# Patient Record
Sex: Female | Born: 1960
Health system: Southern US, Community
[De-identification: ages and names within clinical notes are randomized; demographics above are authoritative.]

## PROBLEM LIST (undated history)

## (undated) DIAGNOSIS — E785 Hyperlipidemia, unspecified: Secondary | ICD-10-CM

## (undated) DIAGNOSIS — R51 Headache: Secondary | ICD-10-CM

## (undated) DIAGNOSIS — R519 Headache, unspecified: Secondary | ICD-10-CM

## (undated) DIAGNOSIS — I10 Essential (primary) hypertension: Secondary | ICD-10-CM

## (undated) DIAGNOSIS — S42309A Unspecified fracture of shaft of humerus, unspecified arm, initial encounter for closed fracture: Secondary | ICD-10-CM

## (undated) DIAGNOSIS — I219 Acute myocardial infarction, unspecified: Secondary | ICD-10-CM

## (undated) DIAGNOSIS — K219 Gastro-esophageal reflux disease without esophagitis: Secondary | ICD-10-CM

## (undated) DIAGNOSIS — I2510 Atherosclerotic heart disease of native coronary artery without angina pectoris: Secondary | ICD-10-CM

## (undated) DIAGNOSIS — E669 Obesity, unspecified: Secondary | ICD-10-CM

## (undated) HISTORY — PX: FRACTURE SURGERY: SHX138

## (undated) HISTORY — DX: Hyperlipidemia, unspecified: E78.5

## (undated) HISTORY — DX: Atherosclerotic heart disease of native coronary artery without angina pectoris: I25.10

## (undated) HISTORY — PX: CARDIAC CATHETERIZATION: SHX172

## (undated) HISTORY — DX: Essential (primary) hypertension: I10

## (undated) HISTORY — PX: EYE SURGERY: SHX253

## (undated) HISTORY — DX: Gastro-esophageal reflux disease without esophagitis: K21.9

## (undated) HISTORY — PX: OTHER SURGICAL HISTORY: SHX169

## (undated) HISTORY — PX: MULTIPLE TOOTH EXTRACTIONS: SHX2053

## (undated) HISTORY — PX: APPENDECTOMY: SHX54

## (undated) HISTORY — DX: Obesity, unspecified: E66.9

---

## 1998-05-08 ENCOUNTER — Emergency Department (HOSPITAL_COMMUNITY): Admission: EM | Admit: 1998-05-08 | Discharge: 1998-05-08 | Payer: Self-pay | Admitting: Emergency Medicine

## 1998-05-14 ENCOUNTER — Encounter: Payer: Self-pay | Admitting: Surgery

## 1998-05-14 ENCOUNTER — Ambulatory Visit (HOSPITAL_COMMUNITY): Admission: RE | Admit: 1998-05-14 | Discharge: 1998-05-14 | Payer: Self-pay | Admitting: Surgery

## 1999-04-20 ENCOUNTER — Encounter (INDEPENDENT_AMBULATORY_CARE_PROVIDER_SITE_OTHER): Payer: Self-pay | Admitting: Specialist

## 1999-04-21 ENCOUNTER — Inpatient Hospital Stay (HOSPITAL_COMMUNITY): Admission: EM | Admit: 1999-04-21 | Discharge: 1999-04-22 | Payer: Self-pay | Admitting: Emergency Medicine

## 2000-07-11 ENCOUNTER — Other Ambulatory Visit: Admission: RE | Admit: 2000-07-11 | Discharge: 2000-07-11 | Payer: Self-pay | Admitting: Obstetrics and Gynecology

## 2001-04-20 ENCOUNTER — Encounter: Payer: Self-pay | Admitting: Emergency Medicine

## 2001-04-20 ENCOUNTER — Inpatient Hospital Stay (HOSPITAL_COMMUNITY): Admission: RE | Admit: 2001-04-20 | Discharge: 2001-04-24 | Payer: Self-pay | Admitting: Unknown Physician Specialty

## 2001-05-09 ENCOUNTER — Encounter (HOSPITAL_COMMUNITY): Admission: RE | Admit: 2001-05-09 | Discharge: 2001-08-07 | Payer: Self-pay | Admitting: *Deleted

## 2001-08-08 ENCOUNTER — Encounter (HOSPITAL_COMMUNITY): Admission: RE | Admit: 2001-08-08 | Discharge: 2001-09-19 | Payer: Self-pay | Admitting: *Deleted

## 2001-09-05 ENCOUNTER — Ambulatory Visit (HOSPITAL_COMMUNITY): Admission: RE | Admit: 2001-09-05 | Discharge: 2001-09-05 | Payer: Self-pay | Admitting: *Deleted

## 2001-11-30 ENCOUNTER — Ambulatory Visit (HOSPITAL_BASED_OUTPATIENT_CLINIC_OR_DEPARTMENT_OTHER): Admission: RE | Admit: 2001-11-30 | Discharge: 2001-11-30 | Payer: Self-pay | Admitting: General Surgery

## 2002-11-23 ENCOUNTER — Ambulatory Visit (HOSPITAL_COMMUNITY): Admission: RE | Admit: 2002-11-23 | Discharge: 2002-11-23 | Payer: Self-pay | Admitting: *Deleted

## 2002-11-23 ENCOUNTER — Encounter: Payer: Self-pay | Admitting: *Deleted

## 2002-11-26 ENCOUNTER — Encounter: Payer: Self-pay | Admitting: *Deleted

## 2002-11-26 ENCOUNTER — Ambulatory Visit (HOSPITAL_COMMUNITY): Admission: RE | Admit: 2002-11-26 | Discharge: 2002-11-26 | Payer: Self-pay | Admitting: *Deleted

## 2003-04-08 ENCOUNTER — Other Ambulatory Visit: Admission: RE | Admit: 2003-04-08 | Discharge: 2003-04-08 | Payer: Self-pay | Admitting: Obstetrics and Gynecology

## 2004-09-30 ENCOUNTER — Encounter: Admission: RE | Admit: 2004-09-30 | Discharge: 2004-09-30 | Payer: Self-pay | Admitting: Orthopedic Surgery

## 2005-04-12 ENCOUNTER — Encounter: Payer: Self-pay | Admitting: Emergency Medicine

## 2005-04-12 ENCOUNTER — Inpatient Hospital Stay (HOSPITAL_COMMUNITY): Admission: AD | Admit: 2005-04-12 | Discharge: 2005-04-14 | Payer: Self-pay | Admitting: *Deleted

## 2005-05-17 ENCOUNTER — Ambulatory Visit: Payer: Self-pay | Admitting: Cardiology

## 2005-07-14 ENCOUNTER — Ambulatory Visit: Payer: Self-pay | Admitting: Cardiology

## 2005-10-15 ENCOUNTER — Ambulatory Visit: Payer: Self-pay | Admitting: Cardiology

## 2005-11-26 ENCOUNTER — Ambulatory Visit (HOSPITAL_COMMUNITY): Admission: RE | Admit: 2005-11-26 | Discharge: 2005-11-27 | Payer: Self-pay | Admitting: *Deleted

## 2005-12-09 ENCOUNTER — Ambulatory Visit (HOSPITAL_COMMUNITY): Admission: RE | Admit: 2005-12-09 | Discharge: 2005-12-09 | Payer: Self-pay | Admitting: *Deleted

## 2006-01-07 ENCOUNTER — Ambulatory Visit: Payer: Self-pay | Admitting: Cardiology

## 2006-05-13 ENCOUNTER — Ambulatory Visit (HOSPITAL_COMMUNITY): Admission: RE | Admit: 2006-05-13 | Discharge: 2006-05-13 | Payer: Self-pay | Admitting: *Deleted

## 2007-01-07 IMAGING — CT CT CHEST W/ CM
3 of 5 series · 16 of 30 positions shown, 18 images · IV contrast ([ID] [id number])
Comparison: Chest radiographs 11/26/05 and chest CT 04/28/03.

CLINICAL DATA: Patient with recent pneumonia.  Nodule seen chest x-ray.  
 CHEST CT WITH CONTRAST:
TECHNIQUE: Multidetector CT imaging of the chest was performed following the standard protocol during bolus administration of intravenous contrast.
 Contrast:  100 cc Omnipaque 300

[Series 3: chest · axial · 0.68mm/px · z∈[-200,-193]mm · 2 of 113 slices shown]
[im 57/113  lung]
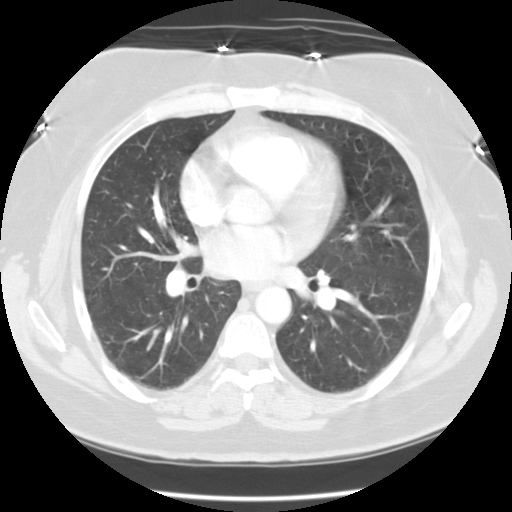
[im 60/113  lung]
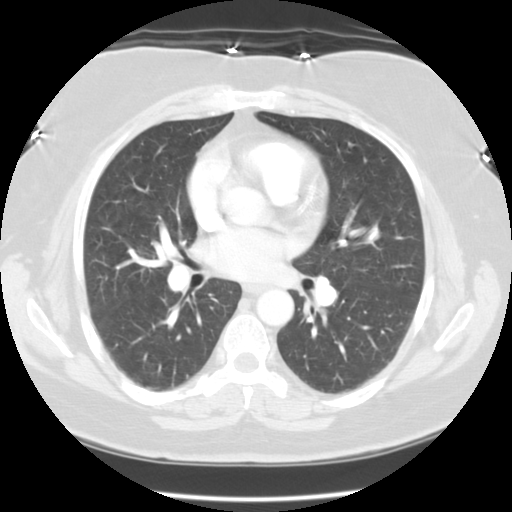

[Series 5: recon 3: chest · axial · 0.68mm/px · z∈[-300,-100]mm · 7 of 225 slices shown, 9 images]
[im 33/225  mediastinal]
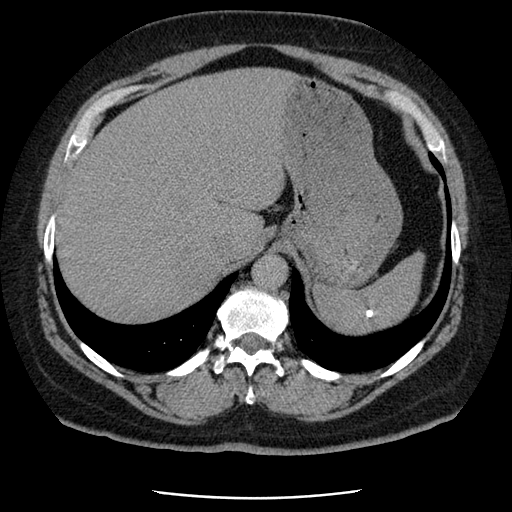
[im 33/225  lung]
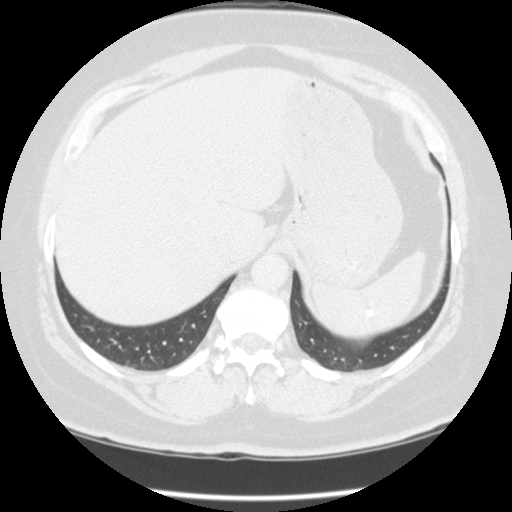
[im 65/225  lung]
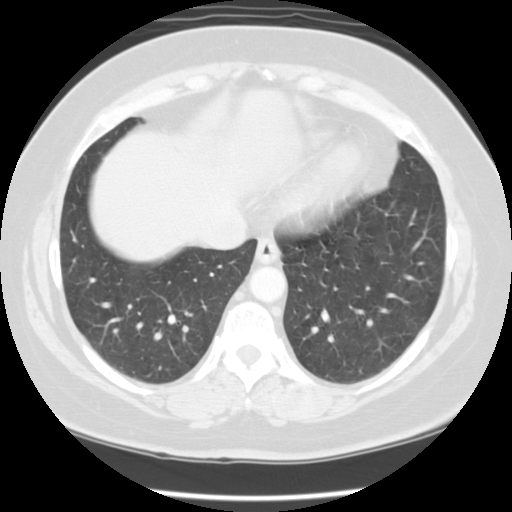
[im 97/225  lung]
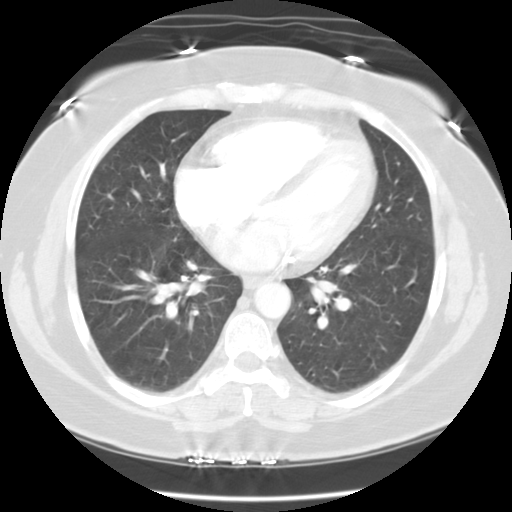
[im 119/225  lung]
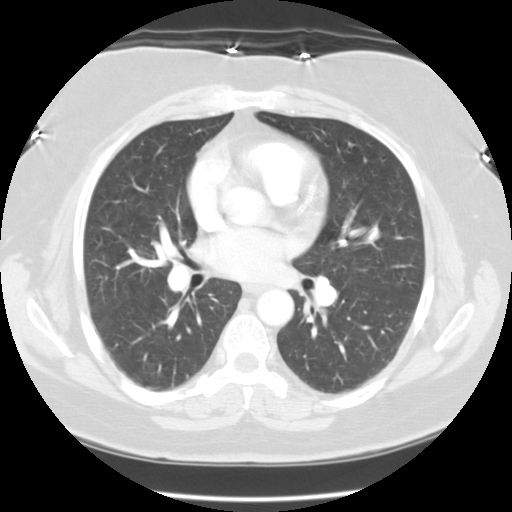
[im 129/225  mediastinal]
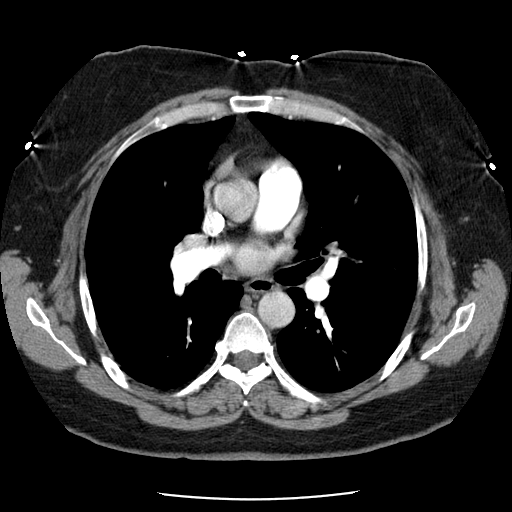
[im 129/225  lung]
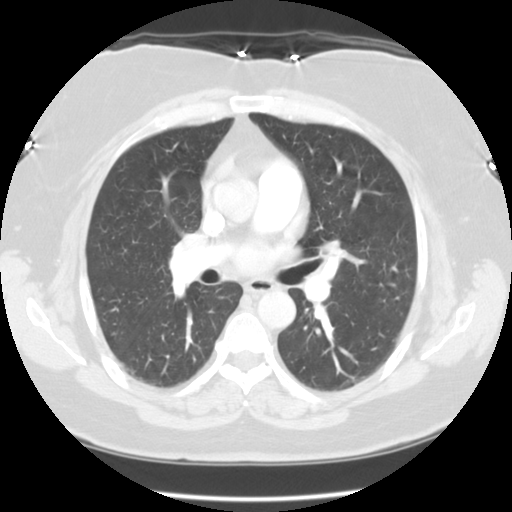
[im 161/225  lung]
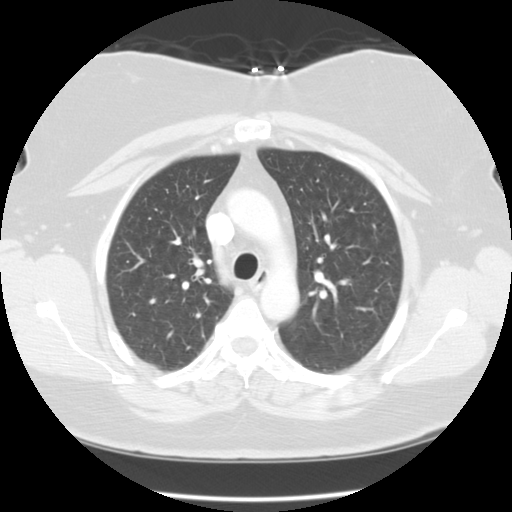
[im 193/225  lung]
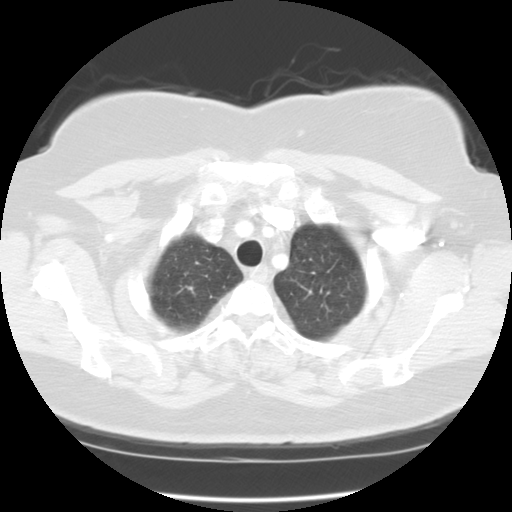

[Series 501: reformatted · sagittal · 0.68mm/px · 7 of 249 slices shown]
[im 32/249  lung]
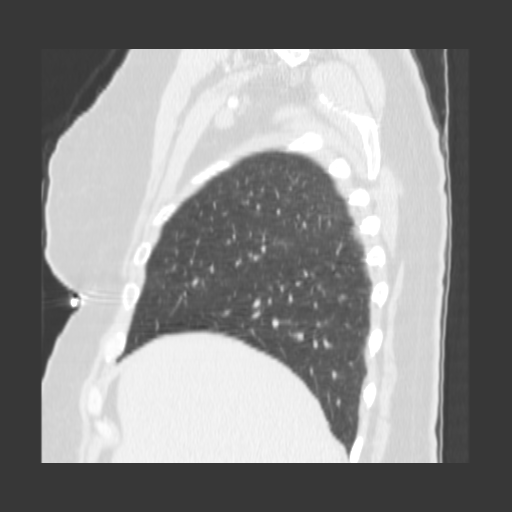
[im 63/249  lung]
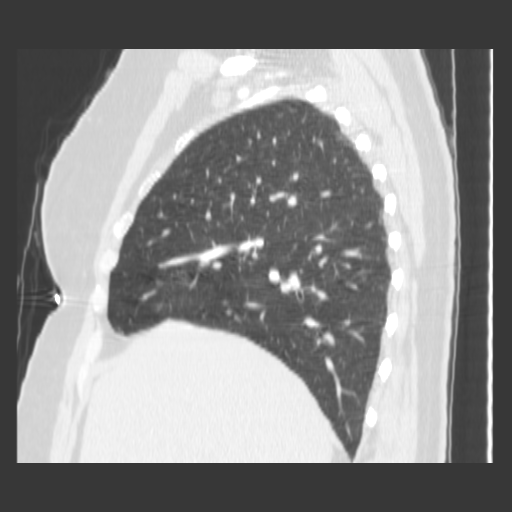
[im 94/249  lung]
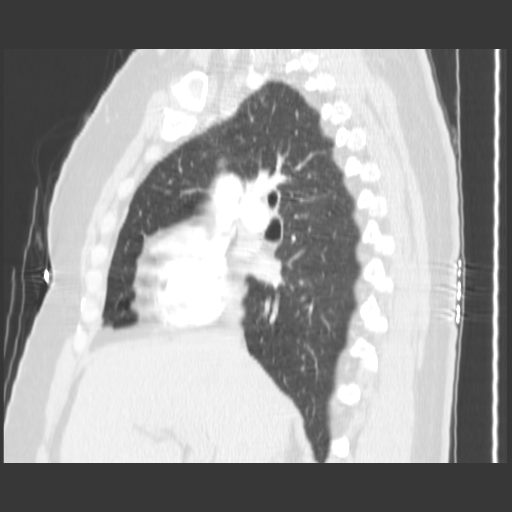
[im 125/249  lung]
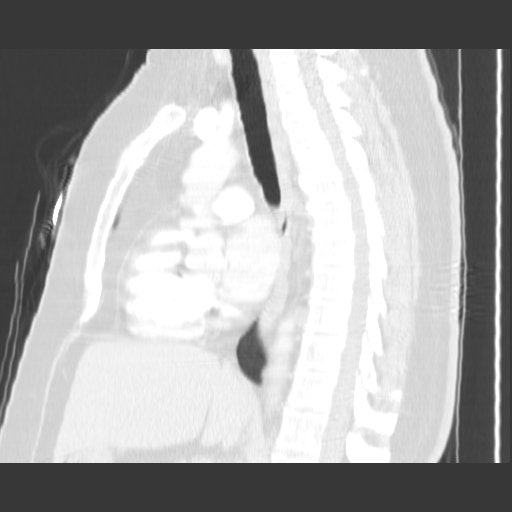
[im 156/249  lung]
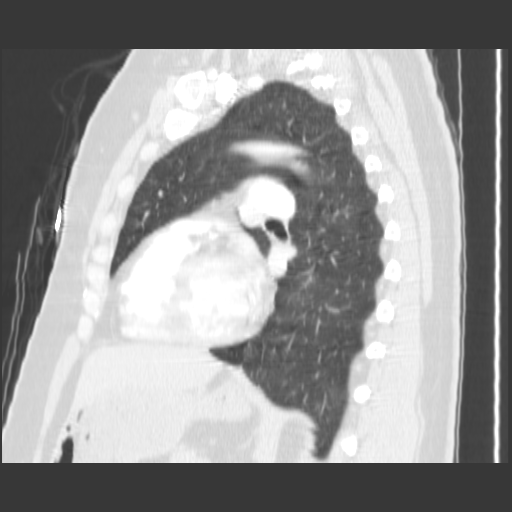
[im 187/249  lung]
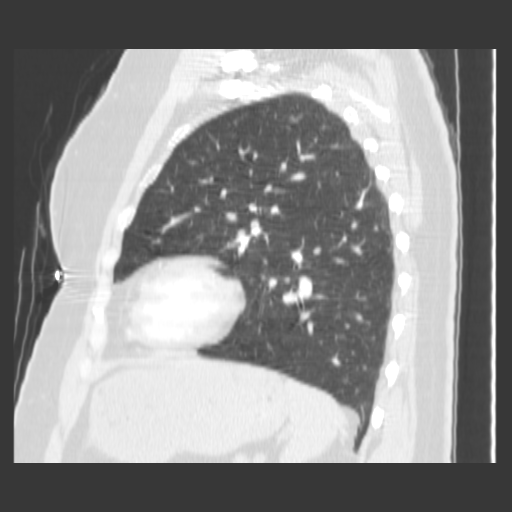
[im 218/249  lung]
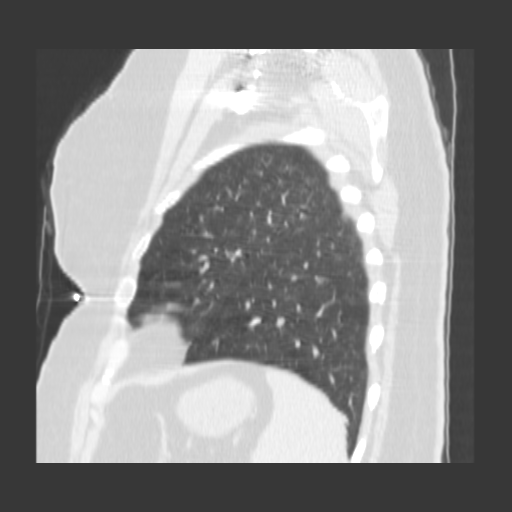

[16 of 30 positions shown; findings below may reference images not displayed]

FINDINGS: There is no axillary, hilar, or mediastinal lymphadenopathy.  Coronary artery stents are noted.  There is no pleural or pericardial effusion.  Lungs demonstrate changes of centrilobular emphysema diffusely.  However, there is no pulmonary nodule or mass.  Incidental imaging of the upper abdomen demonstrates calcified granulomas in the spleen.  Upper abdomen is otherwise unremarkable.  No focal bony abnormality.
IMPRESSION: 1.  Negative for pulmonary nodule or mass.  
 2.  Centrilobular emphysema.

## 2008-09-23 ENCOUNTER — Ambulatory Visit: Payer: Self-pay | Admitting: Internal Medicine

## 2008-10-04 ENCOUNTER — Ambulatory Visit: Payer: Self-pay | Admitting: Internal Medicine

## 2008-10-04 ENCOUNTER — Ambulatory Visit: Payer: Self-pay

## 2008-10-04 LAB — CONVERTED CEMR LAB
ALT: 35 U/L
AST: 21 U/L
Albumin: 3.8 g/dL
Alkaline Phosphatase: 66 U/L
BUN: 16 mg/dL
Bilirubin, Direct: 0.1 mg/dL
CO2: 28 meq/L
Calcium: 9.1 mg/dL
Chloride: 104 meq/L
Cholesterol: 182 mg/dL
Creatinine, Ser: 0.8 mg/dL
GFR calc Af Amer: 99 mL/min
GFR calc non Af Amer: 82 mL/min
Glucose, Bld: 103 mg/dL — ABNORMAL HIGH
HDL: 39.4 mg/dL
LDL Cholesterol: 112 mg/dL — ABNORMAL HIGH
Potassium: 4 meq/L
Sodium: 140 meq/L
Total Bilirubin: 0.8 mg/dL
Total CHOL/HDL Ratio: 4.6
Total Protein: 6.5 g/dL
Triglycerides: 151 mg/dL — ABNORMAL HIGH
VLDL: 30 mg/dL

## 2009-01-28 ENCOUNTER — Encounter (INDEPENDENT_AMBULATORY_CARE_PROVIDER_SITE_OTHER): Payer: Self-pay | Admitting: *Deleted

## 2009-02-12 ENCOUNTER — Ambulatory Visit: Payer: Self-pay | Admitting: Internal Medicine

## 2009-02-12 DIAGNOSIS — L723 Sebaceous cyst: Secondary | ICD-10-CM | POA: Insufficient documentation

## 2009-02-12 DIAGNOSIS — L988 Other specified disorders of the skin and subcutaneous tissue: Secondary | ICD-10-CM | POA: Insufficient documentation

## 2009-02-12 DIAGNOSIS — K219 Gastro-esophageal reflux disease without esophagitis: Secondary | ICD-10-CM | POA: Insufficient documentation

## 2009-02-12 DIAGNOSIS — E785 Hyperlipidemia, unspecified: Secondary | ICD-10-CM | POA: Insufficient documentation

## 2009-02-12 DIAGNOSIS — I10 Essential (primary) hypertension: Secondary | ICD-10-CM | POA: Insufficient documentation

## 2009-02-14 ENCOUNTER — Encounter (INDEPENDENT_AMBULATORY_CARE_PROVIDER_SITE_OTHER): Payer: Self-pay | Admitting: *Deleted

## 2009-10-16 ENCOUNTER — Telehealth: Payer: Self-pay | Admitting: Internal Medicine

## 2009-11-10 DIAGNOSIS — E663 Overweight: Secondary | ICD-10-CM | POA: Insufficient documentation

## 2009-11-14 ENCOUNTER — Ambulatory Visit: Payer: Self-pay | Admitting: Internal Medicine

## 2009-11-18 DIAGNOSIS — F17200 Nicotine dependence, unspecified, uncomplicated: Secondary | ICD-10-CM | POA: Insufficient documentation

## 2009-11-26 ENCOUNTER — Ambulatory Visit: Payer: Self-pay | Admitting: Internal Medicine

## 2009-11-26 DIAGNOSIS — H9319 Tinnitus, unspecified ear: Secondary | ICD-10-CM | POA: Insufficient documentation

## 2010-01-23 ENCOUNTER — Ambulatory Visit: Payer: Self-pay

## 2010-01-23 ENCOUNTER — Ambulatory Visit: Payer: Self-pay | Admitting: Internal Medicine

## 2010-01-23 DIAGNOSIS — M79609 Pain in unspecified limb: Secondary | ICD-10-CM | POA: Insufficient documentation

## 2010-02-03 ENCOUNTER — Ambulatory Visit: Payer: Self-pay

## 2010-02-03 ENCOUNTER — Encounter: Payer: Self-pay | Admitting: Internal Medicine

## 2010-06-21 ENCOUNTER — Emergency Department (HOSPITAL_COMMUNITY): Admission: EM | Admit: 2010-06-21 | Discharge: 2010-06-21 | Payer: Self-pay | Admitting: Emergency Medicine

## 2010-06-22 ENCOUNTER — Encounter: Payer: Self-pay | Admitting: Internal Medicine

## 2010-06-23 ENCOUNTER — Encounter: Payer: Self-pay | Admitting: Internal Medicine

## 2010-06-23 ENCOUNTER — Telehealth: Payer: Self-pay | Admitting: Internal Medicine

## 2010-06-26 ENCOUNTER — Encounter: Payer: Self-pay | Admitting: Internal Medicine

## 2010-10-11 ENCOUNTER — Encounter: Payer: Self-pay | Admitting: Orthopedic Surgery

## 2010-10-18 LAB — CONVERTED CEMR LAB
ALT: 43 units/L — ABNORMAL HIGH (ref 0–35)
AST: 31 units/L (ref 0–37)
Alkaline Phosphatase: 79 units/L (ref 39–117)
BUN: 20 mg/dL (ref 6–23)
Basophils Absolute: 0 10*3/uL (ref 0.0–0.1)
Basophils Relative: 0.5 % (ref 0.0–3.0)
Bilirubin, Direct: 0.1 mg/dL (ref 0.0–0.3)
CO2: 27 meq/L (ref 19–32)
Eosinophils Absolute: 0.3 10*3/uL (ref 0.0–0.7)
Eosinophils Relative: 5.6 % — ABNORMAL HIGH (ref 0.0–5.0)
GFR calc non Af Amer: 62.94 mL/min (ref >60)
HCT: 45.9 % (ref 36.0–46.0)
HDL: 43.5 mg/dL (ref >39.00)
Lymphs Abs: 2.1 10*3/uL (ref 0.7–4.0)
Monocytes Relative: 7.1 % (ref 3.0–12.0)
Neutro Abs: 3 10*3/uL (ref 1.4–7.7)
Neutrophils Relative %: 51 % (ref 43.0–77.0)
Platelets: 245 10*3/uL (ref 150.0–400.0)
Potassium: 4.8 meq/L (ref 3.5–5.1)
Sodium: 139 meq/L (ref 135–145)
Total Bilirubin: 0.7 mg/dL (ref 0.3–1.2)
Total Protein: 7.4 g/dL (ref 6.0–8.3)

## 2010-10-20 NOTE — Letter (Signed)
Summary: [facility]  [facility]   Imported By: Maryln Gottron 07/02/2010 15:13:06  _____________________________________________________________________  External Attachment:    Type:   Image     Comment:   External Document

## 2010-10-20 NOTE — Letter (Signed)
Summary: [facility] Clear[facility]   [facility]   Imported By: Roderic Ovens 07/01/2010 10:59:21  _____________________________________________________________________  External Attachment:    Type:   Image     Comment:   External Document

## 2010-10-20 NOTE — Letter (Signed)
Summary: [facility]  [facility]   Imported By: Maryln Gottron 06/26/2010 14:02:38  _____________________________________________________________________  External Attachment:    Type:   Image     Comment:   External Document

## 2010-10-20 NOTE — Progress Notes (Signed)
Summary: REFILL   Phone Note Refill Request Message from:  Patient on October 16, 2009 11:09 AM  Refills Requested: Medication #1:  PANTOPRAZOLE SODIUM 40 MG TBEC 1 once daily  Medication #2:  FOLIC ACID 1 MG TABS 1 once daily SEND TO [facility] [phone]  Initial call taken by: Judie Grieve,  October 16, 2009 11:10 AM  Follow-up for Phone Call        called [facility] spoke to Dr Kirtland Bouchard, they will fill. Follow-up by: Hardin Negus, RMA,  October 16, 2009 12:35 PM

## 2010-10-20 NOTE — Assessment & Plan Note (Signed)
Summary: EVAL OF RINGING IN EARS (TINNITUS?) // RS PT RSC/NJR//PT RESC...   Vital Signs:  Patient profile:   50 year old female Weight:      238 pounds Temp:     98.4 degrees F oral BP sitting:   76 / 50  (right arm) Cuff size:   regular  Vitals Entered By: Duard Brady LPN (November 26, 1608 10:41 AM) CC: c/o ringing in both ears for quite sometime    - needs refill on ppi #90 3RF please Is Patient Diabetic? No   CC:  c/o ringing in both ears for quite sometime    - needs refill on ppi #90 3RF please.  History of Present Illness: 50 year old patient who has a history of coronary artery disease, hypertension, and dyslipidemia, who is followed closely by cardiology.  She has a long history of mild tinnitus which has intensified over the past 4 weeks.  She describes a very loud ringing in the she believes is more localized to the right ear.  Denies any hearing loss, vertigo, or any other focal neurological symptoms.  No pertinent history for unusual noise exposure or trauma  Preventive Screening-Counseling & Management  Alcohol-Tobacco     Smoking Status: current  Allergies: 1)  ! Gnp Iodine (Iodine)  Past History:  Past Medical History: 1. Premature coronary artery disease.     a.     Status post MI x2.     b.     Status post stenting of the left circumflex in 2002.     c.     Status post in-stent restenosis to the left circumflex with      Cypher drug-eluting stent in 2006, also stenting of the LAD, and      angioplasty of the diagonal in 2006. 2. ongoing tobacco use 3. statin therapy 4. Hyperlipidemia 5. Hypertension 6. GERD 7. Obesity Tinnitus  Family History: Reviewed history from 02/12/2009 and no changes required. father had a MI at age 22 and died of coronary disease at 28 mother died following surgery for abdominal aortic aneurysm  One brother died in a motor vehicle accident  Review of Systems  The patient denies anorexia, fever, weight loss, weight  gain, vision loss, decreased hearing, hoarseness, chest pain, syncope, dyspnea on exertion, peripheral edema, prolonged cough, headaches, hemoptysis, abdominal pain, melena, hematochezia, severe indigestion/heartburn, hematuria, incontinence, genital sores, muscle weakness, suspicious skin lesions, transient blindness, difficulty walking, depression, unusual weight change, abnormal bleeding, enlarged lymph nodes, angioedema, and breast masses.    Physical Exam  General:  overweight-appearing.  96/64overweight-appearing.   Head:  Normocephalic and atraumatic without obvious abnormalities. No apparent alopecia or balding. Eyes:  No corneal or conjunctival inflammation noted. EOMI. Perrla. Funduscopic exam benign, without hemorrhages, exudates or papilledema. Vision grossly normal. Ears:  External ear exam shows no significant lesions or deformities.  Otoscopic examination reveals clear canals, tympanic membranes are intact bilaterally without bulging, retraction, inflammation or discharge. Hearing is grossly normal bilaterally. Weber does not lateralize Mouth:  Oral mucosa and oropharynx without lesions or exudates.  Teeth in good repair. Neurologic:  alert & oriented X3, cranial nerves II-XII intact, strength normal in all extremities, sensation intact to pinprick, and gait normal.  alert & oriented X3, cranial nerves II-XII intact, strength normal in all extremities, sensation intact to pinprick, and gait normal.     Impression & Recommendations:  Problem # 1:  HYPERTENSION (ICD-401.9)  Her updated medication list for this problem includes:    Metoprolol Tartrate  25 Mg Tabs (Metoprolol tartrate) .Marland Kitchen... 1 two times a day    Ramipril 2.5 Mg Caps (Ramipril) .Marland Kitchen... 1 once daily  Her updated medication list for this problem includes:    Metoprolol Tartrate 25 Mg Tabs (Metoprolol tartrate) .Marland Kitchen... 1 two times a day    Ramipril 2.5 Mg Caps (Ramipril) .Marland Kitchen... 1 once daily  Problem # 2:  TINNITUS, CHRONIC,  RIGHT (ICD-388.30)  Orders: ENT Referral (ENT)  Complete Medication List: 1)  Pantoprazole Sodium 40 Mg Tbec (Pantoprazole sodium) .Marland Kitchen.. 1 once daily 2)  Plavix 75 Mg Tabs (Clopidogrel bisulfate) .Marland Kitchen.. 1 once daily 3)  Bufferin 325 Mg Tabs (Aspirin buf(cacarb-mgcarb-mgo)) .Marland Kitchen.. 1 once daily 4)  Folic Acid 1 Mg Tabs (Folic acid) .Marland Kitchen.. 1 once daily 5)  Metoprolol Tartrate 25 Mg Tabs (Metoprolol tartrate) .Marland Kitchen.. 1 two times a day 6)  Isosorbide Mononitrate Cr 30 Mg Xr24h-tab (Isosorbide mononitrate) .Marland Kitchen.. 1 by mouth dialy 7)  Ramipril 2.5 Mg Caps (Ramipril) .Marland Kitchen.. 1 once daily 8)  Crestor 40 Mg Tabs (Rosuvastatin calcium) .Marland Kitchen.. 1 once daily 9)  Nitrostat 0.4 Mg Subl (Nitroglycerin) .... As needed 10)  Vitamin B-12 100 Mcg Tabs (Cyanocobalamin) .Marland Kitchen.. 1 by mouth daily  Patient Instructions: 1)  Please schedule a follow-up appointment in 6 months. 2)  Limit your Sodium (Salt). 3)  It is important that you exercise regularly at least 20 minutes 5 times a week. If you develop chest pain, have severe difficulty breathing, or feel very tired , stop exercising immediately and seek medical attention. 4)  You need to lose weight. Consider a lower calorie diet and regular exercise.  5)  ENT referral

## 2010-10-20 NOTE — Assessment & Plan Note (Signed)
Summary: ROV  Medications Added ISOSORBIDE MONONITRATE CR 30 MG XR24H-TAB (ISOSORBIDE MONONITRATE) 1 by mouth dialy NITROSTAT 0.4 MG SUBL (NITROGLYCERIN) as needed VITAMIN B-12 100 MCG TABS (CYANOCOBALAMIN) 1 by mouth daily      Allergies Added:   History of Present Illness: [name] is a delightful 50 year old woman with a history of premature coronary artery disease, hyperlipidemia, and ongoing tobacco use.   She is status post 2 myocardial infarctions.  She had her first heart attack at the age of 18 and it was treated with a stent to her left circumflex.  In July 2006, she underwent her most recent catheterization for recurrent angina.  This showed in-stent restenosis of the circumflex as well as 80% lesion in the LAD and first diagonal.  She underwent Cypher drug-eluting stent for in-stent restenosis of the left circumflex as well as to the LAD.  She had angioplasty of the second diagonal.  She was enrolled in the Triton study.  Returns for routine f/u.   From a cardiac point of view, she is doing very well.  She denies any chest pain or shortness of breath.  She is walking on treadmill everday for 30 mins (almost 2 miles) no CP or dyspnea. Still smoking 1 ppd. No orthopena or PND. Has had recent bronchitis which is improving. Previously did not have much warning prior to MIs.  Had u/s of carotid and abdominal aorta which was normal.    Problems Prior to Update: 1)  Overweight/obesity  (ICD-278.02) 2)  Gerd  (ICD-530.81) 3)  Other Specified Disorder of Skin  (ICD-709.8) 4)  Sebaceous Cyst, Infected  (ICD-706.2) 5)  Hypertension  (ICD-401.9) 6)  Hyperlipidemia  (ICD-272.4) 7)  Coronary Artery Disease  (ICD-414.00)  Current Medications (verified): 1)  Pantoprazole Sodium 40 Mg Tbec (Pantoprazole Sodium) .Marland Kitchen.. 1 Once Daily 2)  Plavix 75 Mg Tabs (Clopidogrel Bisulfate) .Marland Kitchen.. 1 Once Daily 3)  Bufferin 325 Mg Tabs (Aspirin Buf(Cacarb-Mgcarb-Mgo)) .Marland Kitchen.. 1 Once Daily 4)  Folic Acid 1 Mg  Tabs (Folic Acid) .Marland Kitchen.. 1 Once Daily 5)  Metoprolol Tartrate 25 Mg Tabs (Metoprolol Tartrate) .Marland Kitchen.. 1 Two Times A Day 6)  Isosorbide Mononitrate Cr 30 Mg Xr24h-Tab (Isosorbide Mononitrate) .Marland Kitchen.. 1 By Mouth Dialy 7)  Ramipril 2.5 Mg Caps (Ramipril) .Marland Kitchen.. 1 Once Daily 8)  Crestor 40 Mg Tabs (Rosuvastatin Calcium) .Marland Kitchen.. 1 Once Daily 9)  Nitrostat 0.4 Mg Subl (Nitroglycerin) .... As Needed 10)  Vitamin B-12 100 Mcg Tabs (Cyanocobalamin) .Marland Kitchen.. 1 By Mouth Daily  Allergies (verified): 1)  ! Gnp Iodine (Iodine)  Past History:  Past Medical History: 1. Premature coronary artery disease.     a.     Status post MI x2.     b.     Status post stenting of the left circumflex in 2002.     c.     Status post in-stent restenosis to the left circumflex with      Cypher drug-eluting stent in 2006, also stenting of the LAD, and      angioplasty of the diagonal in 2006. 2. ongoing tobacco use 3. statin therapy 4. Hyperlipidemia 5. Hypertension 6. GERD 7. Obesity  Vital Signs:  Patient profile:   50 year old female Height:      63 inches Weight:      234 pounds BMI:     41.60 Pulse rate:   71 / minute Resp:     16 per minute BP sitting:   98 / 72  (left arm)  Vitals Entered  By: Marrion Coy, CNA (November 14, 2009 9:07 AM)  Physical Exam  General:  Gen: well appearing. no resp difficulty HEENT: normal Neck: supple. no JVD. Carotids 2+ bilat; no bruits. No lymphadenopathy or thryomegaly appreciated. Cor: PMI nondisplaced. Regular rate & rhythm. No rubs, gallops, murmur. Lungs: clear Abdomen: soft, nontender, nondistended. No hepatosplenomegaly. No bruits or masses. Good bowel sounds. Extremities: no cyanosis, clubbing, rash, edema Neuro: alert & orientedx3, cranial nerves grossly intact. moves all 4 extremities w/o difficulty. affect pleasant     Impression & Recommendations:  Problem # 1:  CORONARY ARTERY DISEASE (ICD-414.00) Overall doing well but previous MIs not preceeded by any  signifcant symptoms. She is 5 years out from previous stress testing. will proceed with treadmill stress test to re-evaluate.  Problem # 2:  HYPERTENSION (ICD-401.9) Blood pressure well controlled. Continue current regimen.  Problem # 3:  HYPERLIPIDEMIA (ICD-272.4) Followed by PCP. She is fasting today so will draw blood for her. GOal LDL < 70. Continue statin.  Problem # 4:  TOBACCO USER (ICD-305.1) Counseled on need to quit.  Other Orders: Treadmill (Treadmill) TLB-BMP (Basic Metabolic Panel-BMET) (80048-METABOL) TLB-CBC Platelet - w/Differential (85025-CBCD) TLB-Hepatic/Liver Function Pnl (80076-HEPATIC) TLB-Lipid Panel (80061-LIPID) TLB-A1C / Hgb A1C (Glycohemoglobin) (83036-A1C) TLB-TSH (Thyroid Stimulating Hormone) (16109-UEA)  Patient Instructions: 1)  Labs today 2)  Your physician has requested that you have an exercise tolerance test.  For further information please visit [email or web address]  Please also follow instruction sheet, as given. 3)  Follow up in 1 year

## 2010-10-20 NOTE — Progress Notes (Signed)
Summary: surg clearance   Phone Note Other Incoming   Caller: Sherri with [employer] of Call: Sherri called stating pt had fallen and broken both of her ankels, she needs to have surgery right aware to repair one of them and needs clearance and ok to stop Plavix.  Per Dr Gala Romney ok for surgery and ok to hold Plavix, Sherri is aware and form faxed Initial call taken by: Meredith Staggers, RN,  June 23, 2010 3:38 PM

## 2010-11-16 ENCOUNTER — Ambulatory Visit: Payer: Self-pay | Admitting: Internal Medicine

## 2010-11-26 ENCOUNTER — Encounter (INDEPENDENT_AMBULATORY_CARE_PROVIDER_SITE_OTHER): Payer: Self-pay | Admitting: *Deleted

## 2010-12-01 NOTE — Letter (Signed)
Summary: Appointment - Reschedule  [facility], Main Office  [address]   [place], [place] [address]   Phone: [phone]  Fax: [phone]     November 26, 2010 MRN: [id number]   [name] [address] [place], [place]  [address]   Dear [name],   Due to a change in our office schedule, your appointment on  March 29,2012 at 4:00 must be changed.  It is very important that we reach you to reschedule this appointment. We look forward to participating in your health care needs. Please contact us at the number listed above at your earliest convenience to reschedule this appointment.     Sincerely, Pension scheme manager

## 2010-12-04 ENCOUNTER — Encounter (INDEPENDENT_AMBULATORY_CARE_PROVIDER_SITE_OTHER): Payer: Self-pay | Admitting: *Deleted

## 2010-12-07 ENCOUNTER — Encounter: Payer: Self-pay | Admitting: Internal Medicine

## 2010-12-08 NOTE — Letter (Signed)
Summary: Appointment - Reschedule  [facility], Main Office  [address]   [place], [place] [address]   Phone: [phone]  Fax: [phone]     December 04, 2010 MRN: [id number]   [name] [address] [place], [place]  [address]   Dear [name],   Due to a change in our office schedule, your appointment on  March 29,2012 at 4:00 must be changed.  It is very important that we reach you to reschedule this appointment. We look forward to participating in your health care needs. Please contact us at the number listed above at your earliest convenience to reschedule this appointment.     Sincerely, Control and instrumentation engineer

## 2010-12-17 ENCOUNTER — Ambulatory Visit: Payer: Self-pay | Admitting: Internal Medicine

## 2010-12-22 ENCOUNTER — Other Ambulatory Visit: Payer: Self-pay | Admitting: Internal Medicine

## 2011-01-04 ENCOUNTER — Ambulatory Visit (INDEPENDENT_AMBULATORY_CARE_PROVIDER_SITE_OTHER): Payer: BC Managed Care – PPO | Admitting: Internal Medicine

## 2011-01-04 ENCOUNTER — Encounter: Payer: Self-pay | Admitting: Internal Medicine

## 2011-01-04 ENCOUNTER — Ambulatory Visit: Payer: Self-pay | Admitting: Internal Medicine

## 2011-01-04 VITALS — BP 112/70 | HR 58 | Resp 18 | Ht 63.0 in | Wt 219.0 lb

## 2011-01-04 DIAGNOSIS — F17200 Nicotine dependence, unspecified, uncomplicated: Secondary | ICD-10-CM

## 2011-01-04 DIAGNOSIS — I2510 Atherosclerotic heart disease of native coronary artery without angina pectoris: Secondary | ICD-10-CM

## 2011-01-04 DIAGNOSIS — E785 Hyperlipidemia, unspecified: Secondary | ICD-10-CM

## 2011-01-04 MED ORDER — ISOSORBIDE MONONITRATE ER 30 MG PO TB24: 30.0000 mg | ORAL_TABLET | Freq: Every day | ORAL | Status: DC

## 2011-01-04 MED ORDER — METOPROLOL TARTRATE 25 MG PO TABS: 25.0000 mg | ORAL_TABLET | Freq: Two times a day (BID) | ORAL | Status: DC

## 2011-01-04 MED ORDER — RAMIPRIL 2.5 MG PO CAPS: 2.5000 mg | ORAL_CAPSULE | Freq: Every day | ORAL | Status: DC

## 2011-01-04 MED ORDER — CLOPIDOGREL BISULFATE 75 MG PO TABS: 75.0000 mg | ORAL_TABLET | Freq: Every day | ORAL | Status: DC

## 2011-01-04 MED ORDER — ROSUVASTATIN CALCIUM 40 MG PO TABS: 40.0000 mg | ORAL_TABLET | Freq: Every day | ORAL | Status: DC

## 2011-01-04 NOTE — Assessment & Plan Note (Signed)
Stable. Stress test last year without any evidence ov ischemia.

## 2011-01-04 NOTE — Assessment & Plan Note (Signed)
Discussed need to quit but not ready at this time due to weight loss efforts.

## 2011-01-04 NOTE — Assessment & Plan Note (Signed)
Due for repeat lipids. Will also check CMET and HgBA1c. Goal LDL < 70.

## 2011-01-04 NOTE — Progress Notes (Signed)
HPI:  [name] is a delightful 50 year old woman with a history of premature coronary artery disease, hyperlipidemia, and ongoing tobacco use.  She is status post 2 myocardial infarctions.  She had her first heart attack at the age of 89 and it was treated with a stent to her left circumflex.  In July 2006, she underwent her most recent catheterization for recurrent angina.  This showed in-stent restenosis of the circumflex as well as 80% lesion in the LAD and first diagonal.  She underwent Cypher drug-eluting stent for in-stent restenosis of the left circumflex as well as to the LAD.  She had angioplasty of the second diagonal.  She was enrolled in the Triton study. Had u/s of carotid and abdominal aorta which was normal.   ETT 5/11; no evidence of ischemia.  Returns for routine f/u.   From a cardiac point of view, she is doing very well. Broke both ankles last year and gained a bunch of weight. Now juicing 3x/day and has lost nearly 20 pounds. Walking on treadmill about 2x/week. No CP or SOB.  Still smoking 1 ppd. Previously did not have much warning prior to MIs.  Has not had lipids checked since last visit.    ROS: All systems negative except as listed in HPI, PMH and Problem List.  Past Medical History  Diagnosis Date  . CAD (coronary artery disease)     cypher stent  . HLD (hyperlipidemia)   . HTN (hypertension)   . GERD (gastroesophageal reflux disease)   . Obesity   . Tinnitus     Current Outpatient Prescriptions  Medication Sig Dispense Refill  . aspirin 325 MG tablet Take 325 mg by mouth daily.        . CRESTOR 40 MG tablet TAKE 1 TABLET AT BEDTIME  30 tablet  9  . isosorbide mononitrate (IMDUR) 30 MG 24 hr tablet TAKE 1 TABLET BY MOUTH EVERY DAY  30 tablet  8  . metoprolol tartrate (LOPRESSOR) 25 MG tablet Take 25 mg by mouth 2 (two) times daily.        . nitroGLYCERIN (NITROSTAT) 0.4 MG SL tablet Place 0.4 mg under the tongue every 5 (five) minutes as needed.        .  pantoprazole (PROTONIX) 40 MG tablet Take 40 mg by mouth daily.        Marland Kitchen PLAVIX 75 MG tablet TAKE 1 TABLET BY MOUTH EVERY DAY  30 tablet  8  . ramipril (ALTACE) 2.5 MG capsule TAKE 1 CAPSULE BY MOUTH EVERY DAY  30 capsule  8  . vitamin B-12 (CYANOCOBALAMIN) 100 MCG tablet Take 50 mcg by mouth daily.        . folic acid (FOLVITE) 1 MG tablet Take 1 mg by mouth daily.           PHYSICAL EXAM: Filed Vitals:   01/04/11 1654  BP: 112/70  Pulse: 58  Resp: 18  General:  well appearing. no resp difficulty HEENT: normal Neck: supple. no JVD. Carotids 2+ bilat; no bruits. No lymphadenopathy or thryomegaly appreciated. Cor: PMI nondisplaced. Regular rate & rhythm. No rubs, gallops, murmur. Lungs: clear Abdomen: obese. soft, nontender, nondistended. No hepatosplenomegaly. No bruits or masses. Good bowel sounds. Extremities: no cyanosis, clubbing, rash, edema Neuro: alert & orientedx3, cranial nerves grossly intact. moves all 4 extremities w/o difficulty. affect pleasant    ECG: Sinus brady 58 No ST-T wave abnormalities.     ASSESSMENT & PLAN:

## 2011-01-04 NOTE — Patient Instructions (Signed)
Your physician recommends that you return for a FASTING lipid profile: Wed. 01/14/11 Your physician wants you to follow-up in: 6 months. You will receive a reminder letter in the mail two months in advance. If you don't receive a letter, please call our office to schedule the follow-up appointment.

## 2011-01-07 ENCOUNTER — Ambulatory Visit: Payer: Self-pay | Admitting: Internal Medicine

## 2011-02-02 NOTE — Assessment & Plan Note (Signed)
[name]                            CARDIOLOGY OFFICE NOTE   [name], [name]                   MRN:          [id number]  DATE:09/23/2008                            DOB:          01/23/1961    PRIMARY CARE PHYSICIAN:  None.   HISTORY OF PRESENT ILLNESS:  [name] is a delightful 50 year old woman  with a history of premature coronary artery disease, hyperlipidemia, and  ongoing tobacco use.   She is status post 2 myocardial infarctions.  She had her first heart  attack at the age of 9 and it was treated with a stent to her left  circumflex.  In July 2006, she underwent her most recent catheterization  for recurrent angina.  This showed in-stent restenosis of the circumflex  as well as 80% lesion in the LAD and first diagonal.  She underwent  Cypher drug-eluting stent for in-stent restenosis of the left circumflex  as well as to the LAD.  She had angioplasty of the second diagonal.  She  was enrolled in the Triton study.  Since Dr. Jenne Campus has left town, she  has been referred to establish long-term care here.   From a cardiac point of view, she is doing very well.  She denies any  chest pain or shortness of breath.  She is walking about a mild and half  during the day as she walks her dog twice a day.  She has not had any  chest pain or shortness of breath with this.  She previously was on  Ranexa.  But I stopped this due to weight gain.  Unfortunately, she  continues to smoke about a pack a day.  She is under a lot of stress.   REVIEW OF SYSTEMS:  She denies any claudication.  She does have some  problems severe gastroesophageal reflux disease as well as arthritis and  fatigue.  The remainder review of systems is negative.   PAST MEDICAL HISTORY:  1. Premature coronary artery disease.      a.     Status post MI x2.      b.     Status post stenting of the left circumflex in 2002.      c.     Status post in-stent restenosis to the left  circumflex with       Cypher drug-eluting stent in 2006, also stenting of the LAD, and       angioplasty of the diagonal in 2006.  2. Tobacco use, ongoing.  3. Severe gastroesophageal reflux disease.  4. Hyperlipidemia.  5. Obesity.  6. Preserved left ventricular function.   CURRENT MEDICATIONS:  1. Vytorin 10/40.  2. Imdur 30 mg a day.  3. Protonix 40 a day.  4. Altace 2.5 a day.  5. Aspirin 325 a day.  6. Lopressor 25 b.i.d.  7. Plavix 75 a day.  8. Folic acid 1 mg a day.   DRUG ALLERGIES:  IODINE.   SOCIAL HISTORY:  She is married with no children.  She works as a  Scientist, research (life sciences) and smokes tobacco one pack per day.  Occasional  alcohol.  No recreational drugs.   FAMILY HISTORY:  Notable for severe vascular disease.  Father died at  age 43 due to heart attack.  Mother died at age 50 due to complications  from the aortic aneurysm.  Brother died at age 72 due to a car accident.   PHYSICAL EXAMINATION:  GENERAL:  She is in no acute distress, ambulates  around the clinic without any respiratory difficulty.  VITAL SIGNS:  Blood pressure is 110/76.  HEENT:  Normal.  NECK:  Supple.  There is no JVD.  Carotids are 2+ bilaterally.  There is  a question of very faint left carotid bruit.  There is no  lymphadenopathy or thyromegaly.  CARDIAC:  PMI is nondisplaced.  Regular rate and rhythm.  No murmurs,  rubs, or gallops.  LUNGS:  Clear.  ABDOMEN:  Obese, nontender, and nondistended.  No hepatosplenomegaly.  Appears to be a soft bruit.  Good bowel sounds.  Nontender.  EXTREMITIES:  Warm with no cyanosis, clubbing, or edema.  DP pulses are  2+ bilaterally.  There is no rash or ulceration.  NEUROLOGIC:  Alert and  oriented x3.  Cranial nerves II-XII are intact.  Moves all 4 extremities  without difficulty.  Affect is pleasant.   EKG shows normal sinus, rhythm rate of 67.  No ST-T wave abnormalities.   ASSESSMENT AND PLAN:  1. Premature coronary artery disease.  She is  doing quite well.  She      is asymptomatic.  She is on a good medical regimen.  I did discuss      with her the need to stop smoking.  We will continue her current      therapy.  She does appear to have a very good warning system and I      told her to take close attention to this.  2. Hyperlipidemia.  She has not had her lipids and liver panel checked      for several years.  We will go ahead and do this.  3. Possible peripheral vascular disease.  We will check a carotid      ultrasound and abdominal ultrasound to evaluate her bruits.  Lower      extremity perfusion seems good.  4. Tobacco use.  I once again we had a long talk about the need to      stop smoking.   DISPOSITION:  Continue current therapy.  She will see me back in 6  months or sooner as needed.    Bevelyn Buckles. Bensimhon, MD  Electronically Signed   DRB/MedQ  DD: 09/23/2008  DT: 09/24/2008  Job #: [id number]

## 2011-02-05 NOTE — Cardiovascular Report (Signed)
[name], [name] NO.:  [id number]   MEDICAL RECORD NO.:  [id number]          PATIENT TYPE:  INP   LOCATION:                               FACILITY:  [facility]   PHYSICIAN:  Darlin Priestly, MD  DATE OF BIRTH:  [birth year]   DATE OF PROCEDURE:  04/13/2005  DATE OF DISCHARGE:  04/14/2005                              CARDIAC CATHETERIZATION   PROCEDURES PERFORMED:  1.  Left heart catheterization.  2.  Coronary angiography.  3.  Left ventriculogram.  4.  Left circumflex/mid.      1.  Percutaneous transluminal coronary balloon angioplasty.      2.  Placement of intracoronary stent.   ATTENDING:  Darlin Priestly, M.D.   COMPLICATIONS:  None.   INDICATIONS:  [name] is a 50 year old female with a positive family  history of CAD, history of acute anterior wall MI in August of 2002 with  subsequent AngioJet PTCA and stenting of her AV circumflex with a non DES  stent.  She has had several catheterizations since that time revealing  noncritical CAD with a widely patent circumflex stent.  Over the last  several months she has noted some mild increasing shortness of breath;  however, on April 12, 2005 she did notice marked increase in her shortness of  breath, dyspnea on exertion.  She subsequently took 30 sublingual  nitroglycerin with relief of her symptoms.  She was admitted to [facility]  where she was noted to have a mild enzyme bump.  She is now brought to  cardiac catheterization to evaluate CAD.   DESCRIPTION OF OPERATION:  After giving informed written consent, the  patient brought to the cardiac catheterization laboratory.  Right and left  groin shaved, prepped, and draped in the usual sterile fashion.  ECG monitor  established.  Using a modified Seldinger technique, a #6-French arterial  sheath inserted in right femoral artery, a #5-French venous sheath inserted  in right femoral vein.  A 6-French diagnostic catheter was then used to  perform  diagnostic angiography.   The left main is a large vessel with no significant disease.   The LAD is a medium sized vessel coursing the apex, gives rise to two  diagonal branches.  The LAD is noted to have mild 60% narrowing after the  takeoff of the second diagonal.  There is no further significant disease in  the LAD.   First diagonal is a small vessel which comes off at a 90 degree angle, has  an 80% ostial lesion.  The second diagonal is a medium sized vessel which  bifurcates distally with no significant disease.   Left circumflex is a large vessel coursing the AV groove and gives rise to  two obtuse marginal branches.  There is a 40% proximal circumflex as well as  a stent noted in the mid portion of the circumflex.  There is diffuse 70%  distal in-stent restenosis as well as 85-90% distal AV groove circumflex  lesion.  The first OM is a medium sized vessel with no significant disease.  The second OM is a large vessel with  no significant disease.   The right coronary artery is a medium sized vessel with is dominant which  gives rise to both the PDA as well as posterolateral branch.  The RCA has  mild 40% proximal and distal stenosis.   Left ventriculogram reveals mild to moderate depressed EF at 40% with global  hypokinesis.  There is inferoapical hypokinesis noted.   HEMODYNAMICS:  Systemic arterial pressure 111/56, LV systemic pressure  107/10, LVEDP 17.   INTERVENTIONAL PROCEDURE:  AV Groove circumflex/mid:  Following diagnostic  angiography a #6-French Voda 3.5 guiding catheter was coaxially engaged in  the left coronary ostium.  A 0.014 guidewire _____ guiding catheter and  positioned in the distal second OM without difficulty.  Next, a Maverick 2.5  x 20 mm balloon was then used to cross the mid AV groove lesion.  One  inflation to 8 atmospheres was performed for a total of 30 seconds.  Follow-  up angiogram revealed good luminal gain.  This balloon was then removed  and  a Cypher 3 x 22 mm stent was then positioned across the AV groove circumflex  lesion extending into the previously placed stent.  This stent was deployed  to 8 atmospheres for a total of 38 seconds.  Two additional inflations to 12  and 14 atmospheres were performed for a total of 50 seconds.  Follow-up  angiogram revealed no evidence of dissection or thrombus or TIMI-3 flow to  the distal vessel.  IV Integrilin was used throughout the case.  Intravenous  dose of heparin given to maintain the ACT between 200-300.   Final orthogonal angiograms revealed less than 10% residual stenosis in the  mid AV groove circumflex stenotic lesion with TIMI 3 flow to the distal  vessel.  At this point we went to conclude the procedure.  All balloons,  wires, and catheters were removed.  Hemostatic sheaths were sewn in place  and patient returned to the ward in stable condition.   CONCLUSION:  1.  Successful percutaneous transluminal coronary angioplasty and placement      of a Cypher 3 x 23 mm stent in the mid AV groove circumflex stenotic      lesion.  2.  Mild to moderately depressed left ventricular systolic function.  3.  Adjunct use of Integrilin infusion.  4.  Enrollment in TRITON study.       RHM/MEDQ  D:  04/13/2005  T:  04/13/2005  Job:  [id number]

## 2011-02-05 NOTE — Cardiovascular Report (Signed)
[name], [name] NO.:  [id number]   MEDICAL RECORD NO.:  [id number]          PATIENT TYPE:  OIB   LOCATION:  2899                         FACILITY:  [facility]   PHYSICIAN:  Darlin Priestly, MD  DATE OF BIRTH:  12-30-1960   DATE OF PROCEDURE:  11/26/2005  DATE OF DISCHARGE:                              CARDIAC CATHETERIZATION   PROCEDURE:  1.  Left heart catheterization.  2.  Coronary angiography.  3.  Left ventriculogram.  4.  Mid left anterior descending  percutaneous transluminal coronary      angiography with placement of intracoronary stent.  5.  Intravascular ultrasound imaging.  6.  Second diagonal ostial percutaneous transluminal coronary angiography.   ATTENDING PHYSICIAN:  Darlin Priestly, M.D.   COMPLICATIONS:  None.   INDICATIONS FOR PROCEDURE:  [name] is a 50 year old female patient of  Dr. Garner Nash initially presented in August 2002 with inferolateral wall  MI with total occlusion of her AV groove circumflex.  She did receive a non-  drug eluting stent at that time with a relatively normal EF.  She was  readmitted in June 2006 with repeat chest pain with in-stent restenosis of  her AV groove circumflex and subsequently received a Cypher 3 by 23 mm stent  in the AV groove circumflex at that time.  She was subsequently enrolled in  the [place].  She recently complained of increasing chest pain.  She  does continue to smoke.  She is now referred for repeat catheterization to  reassess her coronary status.   DESCRIPTION OF PROCEDURE:  After giving informed written consent, the  patient was brought to the cardiac cath lab were the right and left groins  were shaved, prepped and draped in a sterile fashion.  ECG monitoring was  established.  Using modified Seldinger technique, a 6 French arterial sheath  was inserted in the right femoral artery.  6 French diagnostic catheters  were used to perform diagnostic angiography.   The left main is a large vessel with no significant disease.   The LAD is a medium size vessel coursing to the apex with two diagonal  branches.  The LAD is noted to have 60% proximal disease extending across  the first diagonal and then 80% lesion at the take off of the second  diagonal.  The remainder of the LAD has no significant disease.   The first diagonal is a small to medium size vessel with an 80% ostial  lesion.   The second diagonal is a medium size vessel with a 20% ostial lesion.   The AV groove circumflex is a large vessel that courses to the AV groove and  gives rise to two obtuse marginal branches.  There is a stent noted in the  distal mid portion of the AV groove circumflex which appears widely patent.  The first OM is a medium size vessel with no significant disease.  The  second OM is a medium size vessel which bifurcates distally with no  significant disease.   The right coronary artery is a medium size vessel which is dominant and  gives rise to both PDA and posterolateral branch.  There is 50% proximal,  50% mid, and 40% distal narrowing.  The posterolateral branch has no  significant disease.   Left ventriculogram reveals preserved EF of 60%.   HEMODYNAMICS:  Systemic arterial pressure 95/62, LV systolic pressure  101/11, LVEDP 21.   INTERVENTIONAL PROCEDURE:  Following diagnostic angiography, a #6 [place]  guiding catheter was used to engage the left coronary ostium.  A 0.0144  guide-wire was passed down the guiding catheter and used to cross the  proximal LAD lesion and measurements were obtained.  The guide-wire was then  positioned in the distal LAD without difficulty.  Following this, a Voyager  3 by 15 mm balloon was then used to cross the LAD stenotic lesion and two  inflations up to 6 atmospheres were performed for a total of 45 seconds.  Follow up angiogram revealed good luminal gain with no evidence of  dissection or thrombus.  This balloon  was then removed and a Cypher 3 by 28  mm stent was then positioned across the stenotic lesion.  The stent was  deployed at 12 atmospheres for a total of 29 seconds.  A second inflation to  16 atmospheres was performed for a total of 22 seconds.  Follow up angiogram  revealed no evidence dissection or thrombus with good luminal gain.  There  was obvious plaque shift into the second diagonal with 99% second diagonal  ostial lesion.  It should be noted the second diagonal was a larger vessel  than the first diagonal and the plan was to attempt to salvage the second  diagonal if possible.  We then took a Quantum 3.25 by 20 mm stent into the  LAD stent and one inflation of 12 atmospheres for a total of 22 seconds.  This balloon was removed.  Next, a floppy 0.014 guide-wire was advanced down  the guiding catheter and prolapsed through the LAD stent.  The guide-wire  was then pulled back and we were able to successfully wire the second  diagonal.  The wire was then positioned in the distal portion of the  diagonal without difficulty.  Next, a Voyager 2 by 8 mm balloon was then  used to cross into the ostial portion of the diagonal.  Two inflations to a  maximum of 14 atmospheres was performed for a total of 46 seconds.  Follow  up angiogram revealed good luminal gain though there was a 40-50% residual  stenosis.  This balloon was then exchanged for a 2.5 by 8 mm Voyager which  was then used to cross into the lesion.  Two inflations to 8 atmospheres was  performed for a total of 1 minutes 46 seconds.  Follow up angiogram revealed  no evidence of dissection or thrombus with TIMI III flow to the distal  vessel.  This balloon was then removed and we then placed a 40 megahertz  IVUS imaging catheter into the mid LAD and mechanical pull back was  performed throughout the LAD.  The mid LAD was approximately 3 by 3 with no significant plaque.  The stent appeared to be well opposed throughout its  distal,  mid, and proximal segment, though the patient did appear to be  approximately 4 in the proximal segment, there was a moderate amount of  plaque and the stent was opposed.  The more proximal LAD was an  approximately 4 by 4 vessel with a 6 mm left main which appeared to be  widely patent.  The IVUS imaging catheter was removed and the Quantum 3.25  by 21 mm balloon was then reinserted into the mid and proximal portion of  the stent.  Two additional inflations to 18 atmospheres was performed for a  total of approximately 46 seconds.  Follow up angiogram revealed no evidence  of dissection or thrombus with TIMI III flow to the distal vessel.  IV  Angiomax was used throughout the case.  Final orthogonal angiograms revealed  less than 10% residual stenosis in the LAD stenotic lesion with  approximately 20% stenosis in the ostial portion of the second diagonal.  It  should be noted the first diagonal did have approximately 60-70% ostial  stenosis, however, this was unchanged compared to prior to the stent  placement.  At this point, we elected to conclude the procedure.  All  balloons, wires, and catheters were removed.  Hemostatic sheath was sewn in  place and the patient was returned back to the ward in stable condition.   CONCLUSION:  1.  Successful percutaneous transluminal coronary angiography with      intravascular ultrasound imaging and placement of a Cypher 3 by 28 mm      stent in the proximal LAD ultimately post dilated to approximately 3.48      mm.  2.  Successful percutaneous transluminal coronary angiography of the ostial      second diagonal stenotic lesion.  3.  Normal left ventricular systolic function.  4.  Elevated left ventricular end diastolic pressure.  5.  Angiomax infusion.      Darlin Priestly, MD  Electronically Signed     RHM/MEDQ  D:  11/26/2005  T:  11/27/2005  Job:  [id number]   cc:   Olene Craven, M.D.  Fax: [phone]

## 2011-02-05 NOTE — Op Note (Signed)
[place]  Patient:    [name], [name] Visit Number: [id number] MRN: [id number]          Service Type: DSU Location: [facility] Attending Physician:  Henrene Dodge Dictated by:   Anselm Pancoast. Zachery Dakins, M.D. Proc. Date: 11/30/01 Admit Date:  11/30/2001                             Operative Report  PREOPERATIVE DIAGNOSIS:  Chronic posterior anal fissure.  POSTOPERATIVE DIAGNOSIS:  Chronic posterior anal fissure.  OPERATIVE PROCEDURE:  Proctoscopy and internal sphincterotomy.  SURGEON:  Anselm Pancoast. Zachery Dakins, M.D.  ANESTHESIA:  General.  POSITION:  Lithotomy.  INDICATIONS:  The patient is a 50 year old female who I had managed years ago when she had acute appendicitis and she was seen in the office last week and stated that approximately 6 months ago she had a myocardial infarction. She was hospitalized at [facility] and had a coronary catheterization and stent placed and during the recuperation she got extremely constipated and then has had chronic sphincter spasm and significant pain intermittently since. She has tried conservative management, stool softeners, etc., and on examination she was in severe sphincter spasm in the office and you could see a chronic posterior anal fissure. She did not have a significant amount of external hemorrhoids but because of the persistent recurring pain, I recommended that we examine her under anesthesia and do an internal sphincterotomy. The patient is in agreement, and I discussed with Dr. Jenne Campus, her cardiologist and he said that she would hopefully do fine from a short general anesthesia. She has continued to smoke even though they have tried to coax her to definitely stop that, and she is on Wellbutrin and other chronic medications. I recommended at the time of her examination if we saw significant internal hemorrhoids, we would consider whether she would need a hemorrhoidectomy,  but I think that a sphincterotomy is all that she will need.  DESCRIPTION OF PROCEDURE:  The patient was taken to the operative suite, had induction of general anesthesia and was placed up in the lithotomy position. In this position, now you can see the chronic fissure easily. The proctoscopic exam was performed. She had very poor prep with still some stool in her rectum and this was kind of manually removed. There is not any significant internal hemorrhoids or other abnormalities except for the chronic fissure. I then prepped the perianal area and anus with Betadine surgical solution and draped her in a sterile manner. A small incision to the left was made just outside of the hemorrhoidal area and then the internal sphincter was elevated over a hemostat and then divided with cautery. She is heavy and I made sure that I had basically divided the internal sphincter and then this little incision was closed with three interrupted sutures of 3-0 chromic. The chronic fissure posteriorly appears that the mucosal edges will come together and I did place one simple 3-0 chromic suture to kind of, hopefully, speed up the healing. The patient was awakened at the completion of the anesthesia. I did not inject her with Marcaine because of the poor prep, but I did place Xylocaine ointment within a gauze within the anal area to minimize the sphincter spasm right after surgery. The patient will be released after a short stay in the recovery room. Dictated by:   Anselm Pancoast. Zachery Dakins, M.D. Attending Physician:  Henrene Dodge DD:  11/30/01 TD:  12/01/01 Job: [id number]

## 2011-02-05 NOTE — Cardiovascular Report (Signed)
NAMECARMIE, [name]            ACCOUNT NO.:  [id number]   MEDICAL RECORD NO.:  [id number]          PATIENT TYPE:  OIB   LOCATION:  2899                         FACILITY:  [facility]   PHYSICIAN:  Darlin Priestly, MD  DATE OF BIRTH:  [birth year]   DATE OF PROCEDURE:  DATE OF DISCHARGE:                              CARDIAC CATHETERIZATION   PROCEDURES:  1. Left heart catheterization.  2. Coronary angiography.  3. Left ventriculogram.  4. Abdominal aortogram.   COMPLICATIONS:  None.   INDICATIONS:  Mrs. Bradt is a 50 year old female patient of Dr. Barbee Shropshire  with history of acute inferolateral wall MI in August 2002 with occlusion of  AV circumflex with subsequent placement of a 3-0 stent.  She did develop  instant restenosis with septal placement of 3-0 by 20 stent in the AV  circumflex.  Last cath in March 2007 revealed progression of LAD disease  with 3-0 by 28 stent to the mid LAD and ultimately a post dilated 3.48 mm  with PTCA of the diagonal.  At that time, she had scattered noncardiac  disease of the RCA.  Recently, she complained of increasing chest pain.  She  is now referred for repeat catheterization and reassessment of her CAD.   DESCRIPTION:  After informed consent, the patient was brought to the cardiac  catheterization lab.  Left and right groin were shaved and prepped and  draped in usual sterile fashion.  ECG monitor established.  Using modified  Seldinger technique, a number 6-French arterial sheath inserted in femoral  artery.  A 6-French diagnostic catheter was used to performed, diagnostic  angiography.   Left main is a large vessel with no significant disease.   LAD is a large vessel which course to the apex and gives rise to two  diagonal branches.  There is a stent noted in the early mid portion of the  LAD which appears widely patent.  There is no further significant disease in  the LAD.   The first diagonal is a small vessel with 70% ostial  lesion.   The second diagonal is a medium sized vessel which originates within the mid  LAD stent.  There is a mild 40% ostial narrowing.   The left circumflex was a large vessel which course to the AV groove giving  off two obtuse marginal branches.  There is a stent noted in the distal mid  portion of the of the AV circumflex which appears to be widely patent.   The first OM was a medium sized vessel with no significant disease.   The second OM is a large vessel which bifurcates distally with no  significant disease.   The right coronary artery is a large sized vessel which gives rise to PDA  and small posterolateral branch.  The RCA is noted to have diffuse 40%  proximal, 50% mid and 20% distal disease.  PDA and posterolateral branch  have no significant disease.   Left ventriculogram reveals preserved EF of 50%.   Abdominal aortogram reveals a no significant stenosis.  There is no evidence  of abdominal aneurysm present.   HEMODYNAMIC  DATA:  Arterial pressure 82/53, LV pressure 85/53, LV pressure  85/80, LVDP 17.   CONCLUSION:  1. No significant coronary artery disease involving major vessels, though      two small diagonal branches may be the source of chest pain.  2. Normal LV systolic function.  3. No evidence of renal artery stenosis.  4. No evidence of abdominal aneurysm.      Darlin Priestly, MD  Electronically Signed     RHM/MEDQ  D:  05/13/2006  T:  05/13/2006  Job:  [id number]   cc:   Olene Craven, M.D.

## 2011-02-05 NOTE — Discharge Summary (Signed)
[place]  Patient:    [name], [name]                   MRN: [id number] Adm. Date:  [id number] Disc. Date: [id number] Attending:  Lenise Herald H Dictator:   Raymon Mutton, P.A.                           Discharge Summary  DATE OF BIRTH:  20-Oct-1960.  DISCHARGE DIAGNOSES: 1. Coronary artery disease status post acute myocardial infarction treated    with emergency percutaneous transluminal coronary intervention. 2. Hypercholesterolemia. 3. Tobacco abuse.  HOSPITAL PROCEDURES:  Cardiac catheterization performed by Lenise Herald, M.D., on April 20, 2001.  The patient underwent AngioJet __________, percutaneous transluminal coronary angioplasty, placement of intercoronary stent and intravascular ultrasound imaging to her mid left circumflex artery.  HISTORY OF PRESENT ILLNESS:  [name] presented to the emergency room on April 20, 2001, with complaints of chest pain. She is a 50 year old married white female with no prior history of coronary artery disease and no significant past medical history.  Her EKG on admission showed elevated ST segment in leads II, III, and aVF and inverted ST segment in lead I, aVL and V2.  She was in normal sinus rhythm with heart rate 64.  Cardiac panel revealed first set of cardiac enzymes CK-MB 100.5, total CK 182 with relative index 11.4; second set showed total CPK 1081, CK-MB 118.7, relative index 11; third set showed total CK 1434, CK-MB 135.7, and relative index 9.5.  Her fasting lipid profile showed cholesterol 269, triglycerides 240, HDL 37, and LDL 184.  Hemoglobin 13.9, hematocrit 39.4.  Potassium 3.8, glucose 122, and BUN 8 with creatinine 0.9.  HOSPITAL COURSE:  As mentioned above, the patient underwent cardiac catheterization performed by Dr. Jenne Campus and he entertained the Rheolytic AngioJet system of the mid arteriovenous groove of circumflex artery with adjunct percutaneous  transluminal coronary balloon angioplasty and placement of ___________ 3.0/18 mm coronary stent in the mid artery venous groove stenotic lesion.  Her cath showed normal left ventricular systolic function with ejection fraction of 60%.  She also had a noncritical disease of the left anterior descending artery and right coronary artery.  Intravascular ultrasound imaging revealed vessel diameter of 3.5 x 3.0 with luminal diameter of 3.0 x 3.0 with evidence of thrombus inside the stent prior to the left AngioJet run.  The patient tolerated the procedure well.  She did not develop any complications and Integrelin bolus and drip were given and the patient was transferred to the waiting room with Integrelin drip continued at a rate of 15.5 mL at hour.  She remained in stable condition and over the weekend, did not develop any complications.  She was enrolled in the cardiac rehab program and will follow with the program on an outpatient basis. [name], M.D., saw the patient in rounds on April 23, 2001, and increased the dose of her beta-blocker and ACE inhibitor. The patient tolerated it well and the next day she was discharged home in stable condition on increased dose of this medication and she will continue Plavix and aspirin.  DISCHARGE MEDICATIONS: 1. Plavix 75 mg q.d. 2. Altace 5 mg q.d. 3. Zocor 80 mg q.d. 4. Lopressor 50 mg b.i.d. 5. Wellbutrin 150 mg q.d. 6. Aspirin 325 mg q.d. 7. Protonix 40 mg q.d. p.r.n.  DISCHARGE ACTIVITY:  She was instructed to avoid driving for 48 hours  after discharge and avoid vigorous physical activity for a couple of weeks. She will be staying off of work for four weeks.  DISCHARGE DIET:  Low fat, low cholesterol, low salt diet.  WOUND CARE:  She was instructed to shower and gently wash the groin area with mild soap and if any symptoms like bleeding, increased swelling, redness or increased pain noted, she is to call our office and inform the  physician.  FOLLOW-UP:  A follow-up appointment was scheduled with Dr. Jenne Campus on May 10, 2001, at our office.DD:  04/24/01 TD:  04/26/01 Job: [id number]

## 2011-02-05 NOTE — H&P (Signed)
[name], [name] NO.:  [id number]   MEDICAL RECORD NO.:  [id number]          PATIENT TYPE:  OIB   LOCATION:  2899                         FACILITY:  [facility]   PHYSICIAN:  Darlin Priestly, MD  DATE OF BIRTH:  1961/09/09   DATE OF ADMISSION:  11/26/2005  DATE OF DISCHARGE:                                HISTORY & PHYSICAL   A 50 year old white married female with history of coronary disease with a  non-Q-wave MI.  She has a stent to her circumflex, a Cypher stent, and she  is in the [employer].  She also has history of musculoskeletal  chest wall pain and Mobic controls that type of pain.   The patient was seen by Dr. Jenne Campus on October 18, 2005 for followup of her  coronary disease.  She has a stent in her AV groove circumflex secondary to  a totally occluded AV groove, noncritical disease of her RCA and a normal  EF.  She had end-stent restenosis of her distal AV groove with subsequent  angioplasty and Cypher stent placement as described.  She also had 80%  ostial small diagonal disease with 60% mid LAD disease, 40% proximal as well  as distal RCA disease.   When she saw Dr. Jenne Campus, she had been having recurrent substernal chest  pain with radiation to her left arm.  No nausea or shortness of breath.  Originally cath was planned for October 29, 2005, but she developed the flu  that week.  It was postponed for two weeks, and then when she was to come in  for the postponed cath she developed GI with nausea, vomiting and diarrhea  that was pretty significant, so today was the first day she could get back  for her cardiac catheterization for recurrent angina.   OUTPATIENT MEDICATIONS:  1.  Vytorin 10/40 daily.  2.  Imdur 30 daily.  3.  Protonix 40 daily.  4.  Altace 2.5 daily.  5.  Aspirin 325 daily.  6.  Lopressor 25 b.i.d.  7.  Tryton Study drug.  8.  Mobic p.r.n.   REVIEW OF SYSTEMS:  GENERAL:  Flu and diarrhea and vomiting as  described,  now resolved completely.  SKIN:  Without rashes.  HEENT:  No sinus  infections.  CARDIOVASCULAR:  No chest pain with those episodes.  PULMONARY:  She does get short of breath when she is walking her dog.  She does exercise  at the gym and does not have significant shortness of breath there; it is  more with the dog walking.  GASTROINTESTINAL:  No recurrent diarrhea,  constipation or melena.  No indigestion.  GENITOURINARY:  No hematuria or  dysuria.  ENDOCRINE:  No diabetes or thyroid disease.  MUSCULOSKELETAL:  Is  currently stable, although she does get leg cramps at night at rest.  She  has to walk around the house.  Then her left hip gets in a position where  she has to get up out of the bed to get comfortable with.  We discussed  ortho consult for her for that.  She has great pulses,  doubt vascular.  NEUROLOGIC:  No syncope, no lightheadedness, no dizziness.   FAMILY HISTORY:  Without change.  Mother has high cholesterol.  Father died  at 23 of heart disease.  His first MI was in his 30s.  One brother died at  22 from car accident.   SOCIAL HISTORY:  Married, no children.  Continues to smoke.  She is smoking  3/4 pack a day, but she was smoking three packs a day.  No significant  alcohol.  She is exercising and attempting to lose weight.   ALLERGIES:  IODINE AND SULFA.   PHYSICAL EXAMINATION:  VITAL SIGNS:  Blood pressure in the right arm 105/56,  pulse 67, respirations 18, temperature 98.4, oxygen saturation 95%.  Height  5 feet 3 inches, weight 221.  GENERAL:  Alert and oriented white female in no acute distress.  SKIN:  Warm and dry with brisk capillary refill.  SCLERAE:  Clear.  NECK:  Supple.  No JVD, no bruits, no thyromegaly.  LUNGS:  Clear without rales, rhonchi or wheezes.  HEART:  S1 S2, regular rate and rhythm, somewhat muffled due to a thick  chest wall.  No murmurs noted, no gallop.  ABDOMEN:  Obese, soft, nontender, positive bowel sounds.  Do not  palpate  liver, spleen or masses.  LOWER EXTREMITIES:  Without edema, 2+ pedals to 3+ pedals bilaterally, 2+  posterior tibs bilaterally.  NEUROLOGIC:  Alert and oriented times three.  Follows commands.  Positive  facial symmetry.   LABORATORY WORK:  Pending.   ASSESSMENT:  1.  Continued angina in patient with history of myocardial infarction in the      past and stent in the AV groove with restenosis previously, also      nonobstructive coronary disease and an  80% ostial small diagonal.      Ejection fraction 40%.  2.  Tryton Study participant.  3.  Hyperlipidemia.  4.  Hypertension.  5.  Tobacco abuse.  Continues to smoke although has cut very far back.   PLAN:  Cardiac catheterization today.  Her labs are still pending.  Dr.  Jenne Campus will do her procedure.  She had no questions when I discussed it  with her.      Darcella Gasman. Valarie Merino      Darlin Priestly, MD  Electronically Signed    LRI/MEDQ  D:  11/26/2005  T:  11/26/2005  Job:  [id number]

## 2011-02-05 NOTE — Cardiovascular Report (Signed)
[place]  Patient:    [name], [name]                     MRN: [id number] Proc. Date: 04/20/01 Adm. Date:  04/20/01 Attending:  Darlin Priestly, M.D.                        Cardiac Catheterization  PROCEDURES: 1. Left heart catheterization. 2. Coronary angiography. 3. Left ventriculogram. 4. Insertion of temporary transvenous pacer wire. 5. Left circumflex - mid.    a. AngioJet atherectomy.    b. Percutaneous transluminal coronary balloon angioplasty.    c. Placement of intracoronary stent.    d. Intravascular ultrasound imaging.  COMPLICATIONS:  None.  INDICATIONS:  The patient is a 50 year old, white female, with a positive family history of CAD and a history of tobacco abuse.  The patient developed substernal chest pain approximately 7:30 this morning associated with nausea, shortness of breath, diaphoresis, and emesis.  She presented to the [facility] ER at approximately 11:30 with acute inferior MI.  She is now brought to the cardiac catheterization lab for emergent percutaneous intervention.  DESCRIPTION OF PROCEDURE:  After given informed written consent, the patient was brought to the cardiac catheterization lab where her right and left groins were shaved, prepped, and draped in the usual sterile fashion.  ECG monitoring was established.  The patient was then consented for the acute MI AngioJet trial.  Next, a #6 French venous sheath was inserted in the right femoral vein and a #7 [place] arterial sheath inserted in the right femoral artery. The 6 French diagnostic catheter was then used to perform diagnostic angiography.  This reveals a large left main with no significant disease.  The LAD is a large vessel which coursed to the apex and gave rise to two diagonal branches.  The LAD is noted to have 30% proximal and 40% mid vessel stenotic lesion.  The first and second diagonal is irregular but no significant disease.  The  left circumflex is a large vessel which gives rise to one obtuse marginal and is totally occluded in the mid AV groove.  There is thrombus present. There is 30% proximal AV groove stenosis.  The first OM is a medium sized vessel which bifurcates in its mid segment and has no significant disease.  The right coronary artery is a medium sized vessel which is dominant and gives rise to both the PDA as well as the posterolateral branch.  There is 40% proximal and 40% distal RCA disease.  The PDA and posterolateral branch are medium sized vessels which are irregular but have no high-grade stenosis.  LEFT VENTRICULOGRAM:  The left ventriculogram reveals preserved EF at 60%. There is significant ectopy and I am unable to quantitate the degree of MR.  HEMODYNAMICS:  Systemic arterial pressure 107/67, LV systemic pressure 110/18, LVEDP of 30.  INTERVENTIONAL PROCEDURE:  Left circumflex:  Following diagnostic angiography, a #7 [place] Voda 3.5 guiding catheter with side holes was then coaxially engaged in the left coronary ostium.  Next, a 0.14 Patriot exchange length guidewire was advanced out of the guiding catheter into the mid circumflex. The guide wire was then used to successfully cross the mid AV groove stenotic lesion positioned in the distal OM without difficulty.  The patient was then randomized to the AngioJet trial.  The AngioJet rheolytic catheter was then positioned in the distal AV groove circumflex and three subsequent atherectomy runs were  then performed throughout the mid circumflex.  This revealed a persistent 90% to mid AV groove lesion with TIMI-2 to TIMI-3 flow in the distal AV groove circumflex.  We then used a Maverick 2.75 x 20 mm balloon tracking across the mid AV groove circumflex.  This balloon was then inflated to a maximum of 6 atmospheres for a total of one minute.  Followup angiogram revealed no evidence of dissection or thrombus with TIMI-3 flow to the  distal vessel.  We then used a NIR Elite 3.0 x 18 mm stent which was then carefully positioned in the mid AV groove to not compromise the first OM.  The stent was then deployed to a maximum of 14 atmospheres for a total of one minute. Followup angiogram revealed no evidence of dissection or thrombus with TIMI-3 flow to the distal vessel.  However, there was evidence of thrombus within the stent.  We readvanced the balloon into the mid stented segment and two additional inflations were then performed.  However, there was persistent filling defect in the mid stented segment.  This balloon was then exchanged for a 3.25 x 9 Powell Monorail which was then tracked in to the stented segment. One subsequent inflation for a maximum of 18 atmospheres was then performed for a total of 67 seconds.  There is a persistent filling defect inside the stented segment. This balloon was then removed and again the AngioJet catheter was advanced into the distal AV groove circumflex.  The AngioJet was then used to perform to rheolytic runs throughout the previously stented segment. Followup angiogram revealed marked improvement in the filling defect.  We again advanced the Davenport Ranger balloon inside the stent and two additional inflations were then performed for a maximum of 18 atmospheres for a total of 1 minute and 30 seconds.  Followup angiogram revealed no evidence of dissection or thrombus or TIMI-2 flow to the distal vessel.  Intravenous doses of Integrilin were used.  IV heparin was given to maintain the ACT between 200 and 300.  Final orthogonal angiograms reveal less than 10% residual stenosis in the mid AV groove circumflex with TIMI-3 flow to the distal vessel.  At this point we elected to conclude the procedure.  All balloons, wires, and catheters were removed.  The transvenous pacer wire was removed.  Hemostatic sheath was sewn in place.  The patient was transferred to the CCU in stable  condition.  CONCLUSIONS: 1. Successful rheolytic AngioJet system of the mid arteriovenous groove    circumflex with adjunct percutaneous transluminal coronary balloon     angioplasty and placement of a NIR Elite 3.0 x 18 mm coronary stent in the    mid arteriovenous groove stenotic lesion. 2. Temporary insertion of transvenous pacer wire. 3. Normal left ventricular systolic function. 4. Noncritical disease of the left anterior descending and right coronary    artery. 5. Intravascular ultrasound imaging revealing vessel diameter of 3.5 x 3.0    with a luminal diameter of 3.0 x 3.0 with evidence of thrombus inside the    stent prior to the left AngioJet run. DD:  04/20/01 TD:  04/21/01 Job: [id number]

## 2011-02-05 NOTE — Cardiovascular Report (Signed)
[place]. [facility]  Patient:    DEWANDA, FENNEMA Visit Number: [id number] MRN: [id number]          Service Type: CAT Location: [facility] 2870 01 Attending Physician:  Darlin Priestly Dictated by:   Lenise Herald, M.D. Admit Date:  09/05/2001 Discharge Date: 09/05/2001                          Cardiac Catheterization  NO DICTATION. Dictated by:   Lenise Herald, M.D. Attending Physician:  Darlin Priestly DD:  09/05/01 TD:  09/05/01 Job: [id number]

## 2011-02-05 NOTE — Cardiovascular Report (Signed)
[place]. [facility]  Patient:    [name], [name] Visit Number: [id number] MRN: [id number]          Service Type: CAT Location: [facility] 2870 01 Attending Physician:  Darlin Priestly Dictated by:   Lenise Herald, M.D. Proc. Date: 09/05/01 Admit Date:  09/05/2001 Discharge Date: 09/05/2001                          Cardiac Catheterization  PROCEDURES PERFORMED: 1. Left heart catheterization. 2. Coronary angiography. 3. Left ventriculogram.  ATTENDING:  Lenise Herald, M.D.  COMPLICATIONS:  None.  INDICATIONS:  [name] is a 50 year old female with a history of CAD, status post an acute inferolateral wall MI on April 20, 2001, with subsequent angiojet, PTCA, and stenting of her AV groove circumflex using a NIR Elite 3.0 x 18 mm stent.  The patient has continued to smoke.  She recently has complained of recurrent chest pain similar to her prior angina.  She is now referred back for repeat catheterization.  DESCRIPTION OF PROCEDURE:  After giving informed written consent, the patient was brought to the cardiac catheterization laboratory where her right and left groins were shaved, prepped, and draped in the usual sterile fashion.  ECG monitoring was established.  Using the modified Seldinger technique, a 6-French arterial sheath was inserted into the right femoral artery.  Six French diagnostic catheters were then used to perform diagnostic angiography.  There was a large left main with no significant disease.  The LAD is a large vessel which coursed the apex and gave rise to two diagonal branches.  The LAD has no significant disease.  The first and second diagonals were small vessels with no significant disease.  The left circumflex is a large vessel which coursed in the AV groove and gave rise to two obtuse marginal branches.  The AV groove circumflex is noted to have a mid circumflex stent which appears widely patent.  There does  not appear to be any significant disease in the body of the AV groove circumflex. The first OM is a medium sized vessel which bifurcates distally and has no significant disease.  The second OM is a large vessel with no significant disease.  The right coronary artery is a medium sized vessel which is dominant.  It gives rise to both a PDA as well as a posterolateral branch.  Here she is noted to have an aneurysm in its early one-third.  There is a mild 30% distal RCA stenosis.  The PDA and posterolateral branch have no significant disease.  The left ventriculogram reveals observed ejection fraction of 50-55%.  HEMODYNAMICS:  Systemic arterial pressure 108/57, LV systemic pressure 107/12, LV EDP 14.  CONCLUSIONS: 1. No significant coronary artery disease. 2. Normal left ventricular systolic function. Dictated by:   Lenise Herald, M.D. Attending Physician:  Darlin Priestly DD:  09/05/01 TD:  09/05/01 Job: [id number]

## 2011-02-05 NOTE — Discharge Summary (Signed)
[name], [name] NO.:  [id number]   MEDICAL RECORD NO.:  [id number]          PATIENT TYPE:  INP   LOCATION:  6533                         FACILITY:  [facility]   PHYSICIAN:  Darlin Priestly, MD  DATE OF BIRTH:  1961-06-30   DATE OF ADMISSION:  04/12/2005  DATE OF DISCHARGE:  04/14/2005                                 DISCHARGE SUMMARY   ADMISSION DIAGNOSES:  1.  Recurrent chest pain, with unstable angina.  2.  Coronary artery disease, with myocardial infarction and stent to      atrioventricular circumflex in August 2002, 40% stenosis after stent,      March 2004.  3.  Chronic obstructive pulmonary disease, with ongoing tobacco use.  4.  Positive family history.   DISCHARGE DIAGNOSES:  1.  Recurrent chest pain, with unstable angina.  2.  Coronary artery disease, with myocardial infarction and stent to      atrioventricular circumflex in August 2002, 40% stenosis after stent,      March 2004.  3.  Chronic obstructive pulmonary disease, with ongoing tobacco use.  4.  Positive family history.   PROCEDURE:  Cardiac catheterization and PCI and stenting of the circumflex.   BRIEF HISTORY:  The patient is a 50 year old white female who had her first  MI in August 2002.  She underwent an AngioJet PTCA and stenting of her AV  circumflex with a non-DES stent.  She has had several catheterizations since  that time, revealing coronary artery disease and widely patent circumflex.  Over the last several months, she has had increasing shortness of breath,  and on April 12, 2005 she had an acute episode of shortness of breath and  dyspnea on exertion.  She took sublingual nitroglycerin with relief and was  admitted to [facility] with an enzyme bump and was subsequently  scheduled and taken to the catheterization lab.   PAST MEDICAL HISTORY:  1.  Hypercholesterolemia.  2.  History of MI in 2002, with prior PCI with stent.   MEDICATIONS ON ADMISSION:  Doses  were uncertain, but she was on aspirin;  Vytorin; Toprol; and Altace.   ALLERGIES:  IODINE.   SOCIAL HISTORY:  She is married.  No children.  One pack a day tobacco use.   FAMILY HISTORY:  Positive.  Her mom died at age 69 with an MI.   For further history and physical, please see the dictated note.   HOSPITAL COURSE:  The patient was admitted and taken to the catheterization  lab on April 13, 2005.  At that time, the LAD showed a 60% narrowing at the  takeoff of the second diagonal.  The first diagonal was a small vessel and  came off at a right angle and had an 80% ostial lesion.  The left circumflex  showed diffuse 70% distal in-stent stenosis as well as an 85-90% distal AV  groove lesion.  The first OM was medium size.  The second OM showed no  significant disease.  The RCA had a 40% proximal and distal stenosis.  The  LV showed ejection fraction at 40%, with  global hypokinesis.  After  completing the study, Dr. Jenne Campus then did a PCI and placement of a 3x23 mm  Cypher stent in the AV groove circumflex stenotic lesion.  The patient  tolerated the procedure well and returned to the floor.  The next day, her  initial blood pressures were in the 80s/40s; heart rates were in the 60s and  70s and sinus.  Her saturations were 95 and 96% on room air.  Her labs were  all within normal limits.  BUN was 11, creatinine was 0.5.  CKs and  troponins were trending down.  Her catheterization site looked good.  She  was mobilized and ultimately discharged home.  She was started on Chantix  for 12 weeks to help her with her smoking cessation.  She was also placed on  Vytorin 10/40, 1 daily; Imdur 30 mg daily; Protonix 40 mg daily; Altace 2.5  mg daily; aspirin 325 mg; Chantix 1 pack for a week's supply, use as  directed; Lopressor 50 mg 1/2 tablet b.i.d.; and she was placed on the  Triton study drug protocol in place of Plavix.  She was scheduled to see Dr.  Jenne Campus in 2 weeks.  Limit activity for  72 hours.  Again, she was counseled  extensively in smoking cessation.      Eber Hong, P.A.      Darlin Priestly, MD  Electronically Signed    WDJ/MEDQ  D:  06/01/2005  T:  06/01/2005  Job:  [id number]   cc:   Darlin Priestly, MD  [phone], [address]  [place]  [place] [address]  Fax: [phone]   Olene Craven, M.D.  [address]  [address]  [place]  [place] [address]  Fax: [phone]

## 2011-02-05 NOTE — Discharge Summary (Signed)
[name], PEKAR NO.:  [id number]   MEDICAL RECORD NO.:  [id number]          PATIENT TYPE:  OIB   LOCATION:  6525                         FACILITY:  [facility]   PHYSICIAN:  Darlin Priestly, MD  DATE OF BIRTH:  [birth year]   DATE OF ADMISSION:  11/26/2005  DATE OF DISCHARGE:  11/27/2005                                 DISCHARGE SUMMARY   DISCHARGE DIAGNOSES:  1.  Coronary artery disease, status post catheterization and stenting of the      mid left anterior descending during this admission, CYPHER stent.  2.  Left ventricular dysfunction with ejection fraction 40% in the past,      last catheterization revealed ejection fraction 60%.  3.  Ongoing tobacco use.  4.  Hyperlipidemia.  5.  TRITON study enrollment.   This is a 50 year old white female who has a known history of coronary  artery disease and was having symptoms of recurrent angina __________.  Last  time patient was seen in our office, October 18, 2005, and decision was made  to undergo repeat catheterization if patient's symptoms have gotten worse.  It was set up on November 26, 2005.   PROCEDURE:  Coronary angiography and intervention performed by Dr. Jenne Campus.  Cath revealed 80% stenosis of the mid LAD and diagonal and Dr. Jenne Campus  performed stenting of the mid LAD with CYPHER stent and reduction of the  lesion from 80 to less than 10%.  Patient tolerated procedure well.  Her  groin did not reveal any signs of ecchymosis or hematoma and the plan was to  keep her overnight post procedure, then discharge home next day if she was  stable after ambulation.   LABORATORY DATA:  Hemoglobin 15.3, hematocrit 37.7, white blood cell count  9.3, platelets 220.  Sodium 137, potassium 3.8, BUN 11, creatinine 1,  glucose 83.   DISCHARGE RECOMMENDATIONS:  Diet:  Low fat, low cholesterol.   No driving, no lifting greater than 5 pounds for three days.  Increase  activity slowly.   DISCHARGE FOLLOW UP:  In our  office in two to three weeks and office will  call patient to set up appointment with Dr. Jenne Campus.   DISCHARGE MEDICATIONS:  1.  Vytorin 10/40 mg daily.  2.  Imdur 30 mg daily.  3.  Aspirin 325 mg daily.  4.  Protonix 40 mg daily.  5.  Altace 2.5 mg daily.  6.  Lopressor 25 mg b.i.d.  7.  TRITON study drug, continue as instructed.      Raymon Mutton, P.A.      Darlin Priestly, MD  Electronically Signed    MK/MEDQ  D:  11/27/2005  T:  11/29/2005  Job:  [id number]   cc:   [facility]   Olene Craven, M.D.  Fax: [phone]

## 2011-02-05 NOTE — Cardiovascular Report (Signed)
NAMEJAMEELAH, [name]                      ACCOUNT NO.:  [id number]   MEDICAL RECORD NO.:  [id number]                   PATIENT TYPE:  OIB   LOCATION:  2899                                 FACILITY:  [facility]   PHYSICIAN:  Darlin Priestly, M.D.             DATE OF BIRTH:  Mar 06, 1961   DATE OF PROCEDURE:  11/23/2002  DATE OF DISCHARGE:                              CARDIAC CATHETERIZATION   PROCEDURE:  1. Left heart catheterization.  2. Coronary angiography.  3. Left ventriculogram.   ATTENDING PHYSICIAN:  Darlin Priestly, M.D.   COMPLICATIONS:  None.   INDICATIONS FOR PROCEDURE:  The patient is a 50 year old female patient of  Dr. Lenise Herald with a history of acute anterior wall MI on April 20, 2001.  She underwent AngioJet treatment of the distal AV groove circumflex  with PTCA and stenting with a NIR Elite 3.0 x 18-mm.  She had some mild  chest pain in late December 2002, prompting re-do catheterization of her  widely patent AV groove stent with noncritical disease of the RCA.  She  underwent a recent Cardiolite scan on November 05, 2002, revealing mild  anteroseptal wall ischemia with normal EF.  She is now brought for repeat  catheterization.   DESCRIPTION OF PROCEDURE:  After giving informed written consent, the  patient was brought to the cardiac catheterization lab where her right and  left groin were shaved, prepped, and draped in the usual sterile fashion.  ECG monitoring was established.  Using modified Seldinger technique, a 6-  French arterial sheath was inserted in the right femoral artery.  Diagnostic  catheters, 6-French, were then used to perform diagnostic angiography.   FINDINGS:  1. This reveals a large left main with no significant disease.  2. The LAD is a large vessel which coursed to the apex and gives rise to 2     diagonal branches.  The LAD has no significant disease.  The first and     second diagonals are medium-sized vessels which  bifurcated distally and     have no significant disease.  3. The left circumflex is a large vessel which courses in the AV groove and     gives rise to 2 obtuse marginal branches.  The AV groove circumflex has     mild 20% proximal and mid narrowing.  There is a widely patent mid AV     groove circumflex stent with no evidence of in-stent restenosis.  There     is mild 40% narrowing beyond the distal portion of the stent.  The first     OM is a large vessel which bifurcates distally and has no significant     disease.  The second OM is a large vessel which has no significant     disease.  4. The right coronary artery is a medium-sized vessel which is dominant.  It     gives rise to a  PDA as well as a posterolateral branch.  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  The     remainder of the RCA is irregular but has no high-grade stenosis.  The     PDA and posterolateral branch are medium-sized vessels with no     significant disease.  5. A left ventriculogram reveals preserved EF of 50% with mild inferior     hypokinesis.   HEMODYNAMICS:  1. Systemic arterial pressure 124/77.  2. LV systemic pressure 124/9.  LVEDP of 16.   CONCLUSION:  1. No significant coronary artery disease.  2. Normal left ventricular systolic function with wall motion abnormality     noted.                                               Darlin Priestly, M.D.    RHM/MEDQ  D:  11/23/2002  T:  11/23/2002  Job:  [id number]

## 2011-03-05 ENCOUNTER — Other Ambulatory Visit: Payer: Self-pay | Admitting: Internal Medicine

## 2011-10-26 ENCOUNTER — Other Ambulatory Visit: Payer: Self-pay

## 2011-10-26 MED ORDER — METOPROLOL TARTRATE 25 MG PO TABS: 25.0000 mg | ORAL_TABLET | Freq: Two times a day (BID) | ORAL | Status: DC

## 2011-10-26 MED ORDER — PANTOPRAZOLE SODIUM 40 MG PO TBEC: 40.0000 mg | DELAYED_RELEASE_TABLET | Freq: Every day | ORAL | Status: DC

## 2012-01-04 ENCOUNTER — Other Ambulatory Visit: Payer: Self-pay

## 2012-01-04 MED ORDER — CLOPIDOGREL BISULFATE 75 MG PO TABS: 75.0000 mg | ORAL_TABLET | Freq: Every day | ORAL | Status: DC

## 2012-01-04 MED ORDER — ISOSORBIDE MONONITRATE ER 30 MG PO TB24: 30.0000 mg | ORAL_TABLET | Freq: Every day | ORAL | Status: DC

## 2012-01-04 MED ORDER — RAMIPRIL 2.5 MG PO CAPS: 2.5000 mg | ORAL_CAPSULE | Freq: Every day | ORAL | Status: DC

## 2012-01-04 MED ORDER — ROSUVASTATIN CALCIUM 40 MG PO TABS: 40.0000 mg | ORAL_TABLET | Freq: Every day | ORAL | Status: DC

## 2012-02-19 ENCOUNTER — Other Ambulatory Visit: Payer: Self-pay | Admitting: *Deleted

## 2012-02-19 MED ORDER — RAMIPRIL 2.5 MG PO CAPS: 2.5000 mg | ORAL_CAPSULE | Freq: Every day | ORAL | Status: DC

## 2012-02-19 MED ORDER — ISOSORBIDE MONONITRATE ER 30 MG PO TB24: 30.0000 mg | ORAL_TABLET | Freq: Every day | ORAL | Status: DC

## 2012-02-19 MED ORDER — CLOPIDOGREL BISULFATE 75 MG PO TABS: 75.0000 mg | ORAL_TABLET | Freq: Every day | ORAL | Status: DC

## 2012-02-19 MED ORDER — ROSUVASTATIN CALCIUM 40 MG PO TABS: 40.0000 mg | ORAL_TABLET | Freq: Every day | ORAL | Status: DC

## 2012-03-28 ENCOUNTER — Other Ambulatory Visit (HOSPITAL_COMMUNITY): Payer: Self-pay | Admitting: Adult Health

## 2012-03-28 ENCOUNTER — Telehealth: Payer: Self-pay | Admitting: Internal Medicine

## 2012-03-28 MED ORDER — RAMIPRIL 2.5 MG PO CAPS: 2.5000 mg | ORAL_CAPSULE | Freq: Every day | ORAL | Status: DC

## 2012-03-28 MED ORDER — ISOSORBIDE MONONITRATE ER 30 MG PO TB24: 30.0000 mg | ORAL_TABLET | Freq: Every day | ORAL | Status: DC

## 2012-03-28 MED ORDER — ROSUVASTATIN CALCIUM 40 MG PO TABS: 40.0000 mg | ORAL_TABLET | Freq: Every day | ORAL | Status: DC

## 2012-03-28 NOTE — Telephone Encounter (Signed)
New problem:  Patient calling need to be set up for lab work on same day she seeing Dr. Antoine Poche.

## 2012-03-28 NOTE — Telephone Encounter (Signed)
Spoke with [name], dr hochrein would prefer to see the [name] before doing lab work. [name] will come fasting to their appt.

## 2012-05-05 ENCOUNTER — Encounter: Payer: Self-pay | Admitting: Cardiology

## 2012-05-05 ENCOUNTER — Ambulatory Visit (INDEPENDENT_AMBULATORY_CARE_PROVIDER_SITE_OTHER): Payer: BC Managed Care – PPO | Admitting: Cardiology

## 2012-05-05 VITALS — BP 109/72 | HR 53 | Ht 63.0 in | Wt 199.0 lb

## 2012-05-05 DIAGNOSIS — I2510 Atherosclerotic heart disease of native coronary artery without angina pectoris: Secondary | ICD-10-CM

## 2012-05-05 MED ORDER — FOLIC ACID 1 MG PO TABS: 1.0000 mg | ORAL_TABLET | Freq: Every day | ORAL | Status: DC

## 2012-05-05 MED ORDER — CLOPIDOGREL BISULFATE 75 MG PO TABS: 75.0000 mg | ORAL_TABLET | Freq: Every day | ORAL | Status: DC

## 2012-05-05 MED ORDER — METOPROLOL TARTRATE 25 MG PO TABS: 25.0000 mg | ORAL_TABLET | Freq: Two times a day (BID) | ORAL | Status: DC

## 2012-05-05 MED ORDER — RAMIPRIL 2.5 MG PO CAPS: 2.5000 mg | ORAL_CAPSULE | Freq: Every day | ORAL | Status: DC

## 2012-05-05 MED ORDER — ISOSORBIDE MONONITRATE ER 30 MG PO TB24: 30.0000 mg | ORAL_TABLET | Freq: Every day | ORAL | Status: DC

## 2012-05-05 MED ORDER — PANTOPRAZOLE SODIUM 40 MG PO TBEC: 40.0000 mg | DELAYED_RELEASE_TABLET | Freq: Every day | ORAL | Status: DC

## 2012-05-05 MED ORDER — ROSUVASTATIN CALCIUM 40 MG PO TABS: 40.0000 mg | ORAL_TABLET | Freq: Every day | ORAL | Status: DC

## 2012-05-05 NOTE — Progress Notes (Signed)
HPI The patient presents for evaluation of CAD.  She was previous followed by Dr. Gala Romney.  She had her first heart attack at the age of 67 and it was treated with a stent to her left circumflex. In July 2006, she underwent her most recent catheterization for recurrent angina. This showed in-stent restenosis of the circumflex as well as 80% lesion in the LAD and first diagonal. She underwent Cypher drug-eluting stent for in-stent restenosis of the left circumflex as well as to the LAD. She had angioplasty of the second diagonal.   Since last being seen she has done well.  The patient denies any new symptoms such as chest discomfort, neck or arm discomfort. There has been no new shortness of breath, PND or orthopnea. There have been no reported palpitations, presyncope or syncope.  She exercises every other day.  She "juices" and has lost weight (40 lbs total).  She continues to smoke.    Allergies  Allergen Reactions  . Iodine     Current Outpatient Prescriptions  Medication Sig Dispense Refill  . aspirin 325 MG tablet Take 325 mg by mouth daily.        . clopidogrel (PLAVIX) 75 MG tablet Take 1 tablet (75 mg total) by mouth daily.  30 tablet  3  . folic acid (FOLVITE) 1 MG tablet Take 1 mg by mouth daily.        . isosorbide mononitrate (IMDUR) 30 MG 24 hr tablet Take 1 tablet (30 mg total) by mouth daily.  30 tablet  2  . metoprolol tartrate (LOPRESSOR) 25 MG tablet Take 1 tablet (25 mg total) by mouth 2 (two) times daily.  60 tablet  6  . nitroGLYCERIN (NITROSTAT) 0.4 MG SL tablet Place 0.4 mg under the tongue every 5 (five) minutes as needed.        . pantoprazole (PROTONIX) 40 MG tablet Take 1 tablet (40 mg total) by mouth daily.  30 tablet  5  . ramipril (ALTACE) 2.5 MG capsule Take 1 capsule (2.5 mg total) by mouth daily.  30 capsule  1  . rosuvastatin (CRESTOR) 40 MG tablet Take 1 tablet (40 mg total) by mouth daily.  30 tablet  2  . vitamin B-12 (CYANOCOBALAMIN) 100 MCG tablet  Take 50 mcg by mouth daily.          Past Medical History  Diagnosis Date  . CAD (coronary artery disease)     cypher stent  . HLD (hyperlipidemia)   . HTN (hypertension)   . GERD (gastroesophageal reflux disease)   . Obesity   . Tinnitus     Past Surgical History  Procedure Date  . Appendectomy   . Cardiac catheterization     ROS:  As stated in the HPI and negative for all other systems.  PHYSICAL EXAM BP 109/72  Pulse 53  Ht 5\' 3"  (1.6 m)  Wt 199 lb (90.266 kg)  BMI 35.25 kg/m2 GENERAL:  Well appearing HEENT:  Pupils equal round and reactive, fundi not visualized, oral mucosa unremarkable NECK:  No jugular venous distention, waveform within normal limits, carotid upstroke brisk and symmetric, no bruits, no thyromegaly LYMPHATICS:  No cervical, inguinal adenopathy LUNGS:  Clear to auscultation bilaterally BACK:  No CVA tenderness CHEST:  Unremarkable HEART:  PMI not displaced or sustained,S1 and S2 within normal limits, no S3, no S4, no clicks, no rubs, no murmurs ABD:  Flat, positive bowel sounds normal in frequency in pitch, no bruits, no rebound, no guarding,  no midline pulsatile mass, no hepatomegaly, no splenomegaly EXT:  2 plus pulses throughout, no edema, no cyanosis no clubbing SKIN:  No rashes no nodules NEURO:  Cranial nerves II through XII grossly intact, motor grossly intact throughout PSYCH:  Cognitively intact, oriented to person place and time  EKG:  Sinus rhythm, rate 53, axis within normal limits, intervals within normal limits, no acute ST wave changes.  05/05/2012   ASSESSMENT AND PLAN  CORONARY ARTERY DISEASE -  The patient has no new sypmtoms.  No further cardiovascular testing is indicated.  We will continue with aggressive risk reduction and meds as listed.  I will bring her back for an ETT in six months.  HYPERLIPIDEMIA -  I will draw lipids today with a Goal LDL < 70.   TOBACCO USER -  Discussed need to quit but not ready at this  time.

## 2012-05-05 NOTE — Patient Instructions (Addendum)
Your physician has requested that you have an exercise tolerance test. For further information please visit [email or web address] Please also follow instruction sheet, as given. This is will be scheduled in 6 months.  Your physician recommends that you return for a FASTING lipid profile: Today 05/05/12.

## 2013-04-04 ENCOUNTER — Other Ambulatory Visit: Payer: Self-pay | Admitting: Cardiology

## 2013-05-15 ENCOUNTER — Telehealth: Payer: Self-pay | Admitting: Cardiology

## 2013-05-15 DIAGNOSIS — E7800 Pure hypercholesterolemia, unspecified: Secondary | ICD-10-CM

## 2013-05-15 DIAGNOSIS — I10 Essential (primary) hypertension: Secondary | ICD-10-CM

## 2013-05-15 DIAGNOSIS — Z79899 Other long term (current) drug therapy: Secondary | ICD-10-CM

## 2013-05-15 NOTE — Telephone Encounter (Signed)
Left message for pt that she needs fasting Lipid and BMP   Requested she call back with any questions

## 2013-05-15 NOTE — Telephone Encounter (Signed)
New Prob     Pt would like blood work for her appt on 10/2. Orders needed in EPIC.

## 2013-06-21 ENCOUNTER — Other Ambulatory Visit: Payer: BC Managed Care – PPO

## 2013-06-22 ENCOUNTER — Ambulatory Visit: Payer: BC Managed Care – PPO | Admitting: Cardiology

## 2013-07-26 ENCOUNTER — Ambulatory Visit: Payer: BC Managed Care – PPO | Admitting: Cardiology

## 2013-07-26 ENCOUNTER — Other Ambulatory Visit: Payer: BC Managed Care – PPO

## 2013-09-06 ENCOUNTER — Ambulatory Visit: Payer: BC Managed Care – PPO | Admitting: Cardiology

## 2013-10-26 ENCOUNTER — Ambulatory Visit (INDEPENDENT_AMBULATORY_CARE_PROVIDER_SITE_OTHER): Payer: BC Managed Care – PPO | Admitting: Cardiology

## 2013-10-26 ENCOUNTER — Other Ambulatory Visit: Payer: BC Managed Care – PPO

## 2013-10-26 ENCOUNTER — Encounter (INDEPENDENT_AMBULATORY_CARE_PROVIDER_SITE_OTHER): Payer: Self-pay

## 2013-10-26 ENCOUNTER — Encounter: Payer: Self-pay | Admitting: Cardiology

## 2013-10-26 VITALS — BP 110/80 | HR 71 | Ht 63.0 in | Wt 216.0 lb

## 2013-10-26 DIAGNOSIS — I10 Essential (primary) hypertension: Secondary | ICD-10-CM

## 2013-10-26 DIAGNOSIS — I2510 Atherosclerotic heart disease of native coronary artery without angina pectoris: Secondary | ICD-10-CM

## 2013-10-26 NOTE — Progress Notes (Signed)
HPI The patient presents for evaluation of CAD.  She was previous followed by Dr. Gala RomneyBensimhon.  She had her first heart attack at the age of 53 and it was treated with a stent to her left circumflex. In July 2006, she underwent her most recent catheterization for recurrent angina. This showed in-stent restenosis of the circumflex as well as 80% lesion in the LAD and first diagonal. She underwent Cypher drug-eluting stent for in-stent restenosis of the left circumflex as well as to the LAD. She had angioplasty of the second diagonal.   Since last being seen she has done well.  The patient denies any new symptoms such as chest discomfort, neck or arm discomfort. There has been no new shortness of breath, PND or orthopnea. There have been no reported palpitations, presyncope or syncope.  She exercises on a treadmill 30 minutes 3 x per week. .  She "juices" and has lost weight (40 lbs total).  She continues to smoke however.      Allergies  Allergen Reactions  . Iodine     Current Outpatient Prescriptions  Medication Sig Dispense Refill  . aspirin 325 MG tablet Take 325 mg by mouth daily.        . clopidogrel (PLAVIX) 75 MG tablet TAKE 1 TABLET EVERY DAY  30 tablet  6  . CRESTOR 40 MG tablet TAKE 1 TABLET EVERY DAY  30 tablet  6  . isosorbide mononitrate (IMDUR) 30 MG 24 hr tablet TAKE 1 TABLET EVERY DAY  30 tablet  6  . metoprolol tartrate (LOPRESSOR) 25 MG tablet TAKE 1 TABLET TWICE A DAY  60 tablet  6  . nitroGLYCERIN (NITROSTAT) 0.4 MG SL tablet Place 0.4 mg under the tongue every 5 (five) minutes as needed.        . pantoprazole (PROTONIX) 40 MG tablet TAKE 1 TABLET EVERY DAY  30 tablet  6  . ramipril (ALTACE) 2.5 MG capsule TAKE ONE CAPSULE EVERY DAY  30 capsule  6   No current facility-administered medications for this visit.    Past Medical History  Diagnosis Date  . CAD (coronary artery disease)     cypher stent  . HLD (hyperlipidemia)   . HTN (hypertension)   . GERD  (gastroesophageal reflux disease)   . Obesity   . Tinnitus     Past Surgical History  Procedure Laterality Date  . Appendectomy    . Cardiac catheterization      ROS:  As stated in the HPI and negative for all other systems.  PHYSICAL EXAM BP 110/80  Pulse 71  Ht 5\' 3"  (1.6 m)  Wt 216 lb (97.977 kg)  BMI 38.27 kg/m2 GENERAL:  Well appearing HEENT:  Pupils equal round and reactive, fundi not visualized, oral mucosa unremarkable NECK:  No jugular venous distention, waveform within normal limits, carotid upstroke brisk and symmetric, no bruits, no thyromegaly LYMPHATICS:  No cervical, inguinal adenopathy LUNGS:  Clear to auscultation bilaterally BACK:  No CVA tenderness CHEST:  Unremarkable HEART:  PMI not displaced or sustained,S1 and S2 within normal limits, no S3, no S4, no clicks, no rubs, no murmurs ABD:  Flat, positive bowel sounds normal in frequency in pitch, no bruits, no rebound, no guarding, no midline pulsatile mass, no hepatomegaly, no splenomegaly EXT:  2 plus pulses throughout, no edema, no cyanosis no clubbing SKIN:  No rashes no nodules NEURO:  Cranial nerves II through XII grossly intact, motor grossly intact throughout PSYCH:  Cognitively intact, oriented to  person place and time  EKG:  Sinus rhythm, rate 71, axis within normal limits, intervals within normal limits, no acute ST wave changes.  10/26/2013   ASSESSMENT AND PLAN  CORONARY ARTERY DISEASE -  The patient has no symptoms. However, given ongoing risk to bring her back for a stress test.  I will bring the patient back for a POET (Plain Old Exercise Test). This will allow me to screen for obstructive coronary disease, risk stratify and very importantly provide a prescription for exercise.  I will reduce her aspirin 81 mg daily. Consider discontinuing the Plavix given the fact she had recurrent events such a young age and is having no complications I have elected to continue DAPT.  HYPERLIPIDEMIA -  I  will draw lipids today with a Goal LDL < 70.     TOBACCO USER -  We discussed a specific strategy for tobacco cessation.  (Greater than three minutes discussing tobacco cessation.)

## 2013-10-26 NOTE — Patient Instructions (Signed)
Will obtain labs today and call you with the results (cmet/lp/cbc)  Your physician has requested that you have an exercise tolerance test. For further information please visit [email or web address] Please also follow instruction sheet, as given.  Your physician wants you to follow-up in: 18 months  You will receive a reminder letter in the mail two months in advance. If you don't receive a letter, please call our office to schedule the follow-up appointment.

## 2013-10-29 LAB — COMPREHENSIVE METABOLIC PANEL
ALBUMIN: 4.2 g/dL (ref 3.5–5.2)
ALK PHOS: 69 U/L (ref 39–117)
ALT: 32 U/L (ref 0–35)
AST: 23 U/L (ref 0–37)
BUN: 11 mg/dL (ref 6–23)
CO2: 26 mEq/L (ref 19–32)
Calcium: 9.4 mg/dL (ref 8.4–10.5)
Chloride: 103 mEq/L (ref 96–112)
Creatinine, Ser: 0.9 mg/dL (ref 0.4–1.2)
GFR: 66.52 mL/min (ref >60.00)
GLUCOSE: 76 mg/dL (ref 70–99)
POTASSIUM: 4.7 meq/L (ref 3.5–5.1)
SODIUM: 136 meq/L (ref 135–145)
TOTAL PROTEIN: 7.2 g/dL (ref 6.0–8.3)
Total Bilirubin: 0.8 mg/dL (ref 0.3–1.2)

## 2013-10-29 LAB — LIPID PANEL
CHOL/HDL RATIO: 7
Cholesterol: 272 mg/dL — ABNORMAL HIGH (ref 0–200)
HDL: 40 mg/dL (ref >39.00)
Triglycerides: 268 mg/dL — ABNORMAL HIGH (ref 0.0–149.0)
VLDL: 53.6 mg/dL — ABNORMAL HIGH (ref 0.0–40.0)

## 2013-10-29 LAB — LDL CHOLESTEROL, DIRECT: LDL DIRECT: 192.8 mg/dL

## 2013-11-28 ENCOUNTER — Telehealth: Payer: Self-pay | Admitting: Cardiology

## 2013-11-28 NOTE — Telephone Encounter (Signed)
New messages  Patient would like results of lab work, please call and advise.

## 2013-11-28 NOTE — Telephone Encounter (Signed)
Reviewed lipid and CMP with pt.  She does not wish to attend the lipid clinic at this time as she had not been juicing and has since restarted.

## 2013-12-07 ENCOUNTER — Encounter: Payer: BC Managed Care – PPO | Admitting: Nurse Practitioner

## 2014-01-10 ENCOUNTER — Encounter: Payer: BC Managed Care – PPO | Admitting: Physician Assistant

## 2014-02-03 ENCOUNTER — Other Ambulatory Visit: Payer: Self-pay | Admitting: Internal Medicine

## 2014-03-07 ENCOUNTER — Encounter: Payer: BC Managed Care – PPO | Admitting: Physician Assistant

## 2014-03-13 ENCOUNTER — Encounter: Payer: Self-pay | Admitting: Physician Assistant

## 2014-04-15 ENCOUNTER — Other Ambulatory Visit: Payer: Self-pay | Admitting: Internal Medicine

## 2014-07-03 ENCOUNTER — Other Ambulatory Visit: Payer: Self-pay | Admitting: Internal Medicine

## 2014-07-04 ENCOUNTER — Other Ambulatory Visit: Payer: Self-pay

## 2014-08-30 ENCOUNTER — Other Ambulatory Visit: Payer: Self-pay | Admitting: Internal Medicine

## 2014-10-10 ENCOUNTER — Other Ambulatory Visit: Payer: Self-pay | Admitting: Internal Medicine

## 2014-10-15 ENCOUNTER — Other Ambulatory Visit: Payer: Self-pay

## 2014-10-15 MED ORDER — RAMIPRIL 2.5 MG PO CAPS: 2.5000 mg | ORAL_CAPSULE | Freq: Every day | ORAL | Status: DC

## 2014-11-03 ENCOUNTER — Other Ambulatory Visit: Payer: Self-pay | Admitting: Internal Medicine

## 2015-02-10 ENCOUNTER — Other Ambulatory Visit: Payer: Self-pay | Admitting: Internal Medicine

## 2015-02-28 ENCOUNTER — Other Ambulatory Visit: Payer: Self-pay | Admitting: Internal Medicine

## 2015-06-17 ENCOUNTER — Other Ambulatory Visit: Payer: Self-pay | Admitting: Cardiology

## 2015-07-07 ENCOUNTER — Other Ambulatory Visit: Payer: Self-pay | Admitting: Cardiology

## 2015-07-18 ENCOUNTER — Ambulatory Visit (INDEPENDENT_AMBULATORY_CARE_PROVIDER_SITE_OTHER): Payer: BLUE CROSS/BLUE SHIELD | Admitting: Cardiology

## 2015-07-18 ENCOUNTER — Encounter: Payer: Self-pay | Admitting: Cardiology

## 2015-07-18 VITALS — BP 110/80 | HR 72 | Ht 63.0 in | Wt 240.5 lb

## 2015-07-18 DIAGNOSIS — I2510 Atherosclerotic heart disease of native coronary artery without angina pectoris: Secondary | ICD-10-CM | POA: Diagnosis not present

## 2015-07-18 DIAGNOSIS — E785 Hyperlipidemia, unspecified: Secondary | ICD-10-CM

## 2015-07-18 NOTE — Patient Instructions (Signed)
Your physician wants you to follow-up in: 1 Year. You will receive a reminder letter in the mail two months in advance. If you don't receive a letter, please call our office to schedule the follow-up appointment.  Your physician has requested that you have an exercise tolerance test. For further information please visit [email or web address] Please also follow instruction sheet, as given.  Your physician recommends that you return for lab work in Fasting Lipids Profile  Your physician has recommended you make the following change in your medication: Decrease Aspirin to 81 mg daily

## 2015-07-18 NOTE — Progress Notes (Signed)
HPI The patient presents for evaluation of CAD.  She was previous followed by Dr. Gala Krueger.  She had her first heart attack at the age of 89 and it was treated with a stent to her left circumflex. In July 2006, she underwent her most recent catheterization for recurrent angina. This showed in-stent restenosis of the circumflex as well as 80% lesion in the LAD and first diagonal. She underwent Cypher drug-eluting stent for in-stent restenosis of the left circumflex as well as to the LAD. She had angioplasty of the second diagonal.   Since I last saw her she has had increased stress is working crazy hours. She's not been having exercise. She's gained weight. The patient denies any new symptoms such as chest discomfort, neck or arm discomfort. There has been no new shortness of breath, PND or orthopnea. There have been no reported palpitations, presyncope or syncope.  Allergies  Allergen Reactions  . Iodine Swelling    Breaks out and swells     Current Outpatient Prescriptions  Medication Sig Dispense Refill  . aspirin 325 MG tablet Take 325 mg by mouth daily.    . clopidogrel (PLAVIX) 75 MG tablet TAKE 1 TABLET BY MOUTH EVERY DAY 30 tablet 6  . CRESTOR 40 MG tablet TAKE 1 TABLET BY MOUTH EVERY DAY 30 tablet 1  . folic acid (FOLVITE) 1 MG tablet TAKE 1 TABLET BY MOUTH EVERY DAY 30 tablet 6  . isosorbide mononitrate (IMDUR) 30 MG 24 hr tablet TAKE 1 TABLET BY MOUTH EVERY DAY 30 tablet 6  . metoprolol tartrate (LOPRESSOR) 25 MG tablet TAKE 1 TABLET BY MOUTH TWICE A DAY 60 tablet 6  . nitroGLYCERIN (NITROSTAT) 0.4 MG SL tablet Place 0.4 mg under the tongue every 5 (five) minutes as needed (chest pain).     . pantoprazole (PROTONIX) 40 MG tablet TAKE 1 TABLET BY MOUTH EVERY DAY 30 tablet 6  . ramipril (ALTACE) 2.5 MG capsule TAKE 1 CAPSULE (2.5 MG TOTAL) BY MOUTH DAILY. 30 capsule 0   No current facility-administered medications for this visit.    Past Medical History  Diagnosis Date  .  CAD (coronary artery disease)     cypher stent  . HLD (hyperlipidemia)   . HTN (hypertension)   . GERD (gastroesophageal reflux disease)   . Obesity   . Tinnitus     Past Surgical History  Procedure Laterality Date  . Appendectomy    . Cardiac catheterization      ROS:  As stated in the HPI and negative for all other systems.  PHYSICAL EXAM BP 110/80 mmHg  Pulse 72  Ht 5\' 3"  (1.6 m)  Wt 240 lb 8 oz (109.09 kg)  BMI 42.61 kg/m2 GENERAL:  Well appearing NECK:  No jugular venous distention, waveform within normal limits, carotid upstroke brisk and symmetric, no bruits, no thyromegaly LUNGS:  Clear to auscultation bilaterally BACK:  No CVA tenderness CHEST:  Unremarkable HEART:  PMI not displaced or sustained,S1 and S2 within normal limits, no S3, no S4, no clicks, no rubs, no murmurs ABD:  Flat, positive bowel sounds normal in frequency in pitch, no bruits, no rebound, no guarding, no midline pulsatile mass, no hepatomegaly, no splenomegaly EXT:  2 plus pulses throughout, no edema, no cyanosis no clubbing, mild edema  EKG:  Sinus rhythm, rate 72, axis within normal limits, intervals within normal limits, no acute ST wave changes.  07/18/2015   ASSESSMENT AND PLAN  CORONARY ARTERY DISEASE -  The patient has  no symptoms. Given ongoing risk to bring her back for a stress test.  I will bring the patient back for a POET (Plain Old Exercise Test). This will allow me to screen for obstructive coronary disease, risk stratify and very importantly provide a prescription for exercise.  I will reduce her aspirin 81 mg daily. Consider discontinuing the Plavix given the fact she had recurrent events such a young age and is having no complications I have elected to continue DAPT.  Of note I tried to order a stress test last year but she never showed up for. I've asked her to please comply with having this done this year.   HYPERLIPIDEMIA -  She will get a lipid profile.   TOBACCO USER -    "If I quit smoking I would be dead."

## 2015-08-21 ENCOUNTER — Telehealth (HOSPITAL_COMMUNITY): Payer: Self-pay

## 2015-08-21 NOTE — Telephone Encounter (Signed)
Encounter complete. 

## 2015-08-26 ENCOUNTER — Ambulatory Visit (HOSPITAL_COMMUNITY)
Admission: RE | Admit: 2015-08-26 | Discharge: 2015-08-26 | Disposition: A | Discharge status: Home / Self Care | Payer: BLUE CROSS/BLUE SHIELD | Source: Ambulatory Visit | Attending: Cardiology | Admitting: Cardiology

## 2015-08-26 DIAGNOSIS — I2510 Atherosclerotic heart disease of native coronary artery without angina pectoris: Secondary | ICD-10-CM

## 2015-08-26 LAB — EXERCISE TOLERANCE TEST
CHL CUP MPHR: 167 {beats}/min
CHL CUP RESTING HR STRESS: 73 {beats}/min
CSEPPHR: 129 {beats}/min
Estimated workload: 10.2 METS
Exercise duration (min): 7 min
Exercise duration (sec): 56 s
Percent HR: 77 %
RPE: 17

## 2015-10-01 ENCOUNTER — Other Ambulatory Visit: Payer: Self-pay | Admitting: Cardiology

## 2015-10-01 NOTE — Telephone Encounter (Signed)
Rx request sent to pharmacy.  

## 2016-01-20 ENCOUNTER — Other Ambulatory Visit: Payer: Self-pay | Admitting: Cardiology

## 2016-01-20 NOTE — Telephone Encounter (Signed)
REFILL 

## 2016-04-21 ENCOUNTER — Other Ambulatory Visit: Payer: Self-pay | Admitting: Cardiology

## 2016-04-21 NOTE — Telephone Encounter (Signed)
Rx request sent to pharmacy.  

## 2016-11-02 ENCOUNTER — Other Ambulatory Visit: Payer: Self-pay | Admitting: Cardiology

## 2016-11-02 NOTE — Telephone Encounter (Signed)
REFILL 

## 2016-12-11 ENCOUNTER — Other Ambulatory Visit: Payer: Self-pay | Admitting: Cardiology

## 2017-01-06 ENCOUNTER — Other Ambulatory Visit: Payer: Self-pay | Admitting: Cardiology

## 2017-01-22 ENCOUNTER — Other Ambulatory Visit: Payer: Self-pay | Admitting: Cardiology

## 2017-01-24 NOTE — Telephone Encounter (Signed)
PT NEEDS APPOINTMENT FOR FURTHER REFILLS 

## 2017-03-05 ENCOUNTER — Other Ambulatory Visit: Payer: Self-pay | Admitting: Cardiology

## 2017-03-16 NOTE — Telephone Encounter (Signed)
°  New Prob    *STAT* If patient is at the pharmacy, call can be transferred to refill team.   1. Which medications need to be refilled? (please list name of each medication and dose if known) clopidogrel (PLAVIX) 75 MG tablet metoprolol tartrate (LOPRESSOR) 25 MG tablet rosuvastatin (CRESTOR) 40 MG tablet ramipril (ALTACE) 2.5 MG capsule pantoprazole (PROTONIX) 40 MG tablet isosorbide mononitrate (IMDUR) 30 MG 24 hr tablet folic acid (FOLVITE) 1 MG tablet  2. Which pharmacy/location (including street and city if local pharmacy) is medication to be sent to? [facility] [address]  3. Do they need a 30 day or 90 day supply? 30 days

## 2017-03-18 MED ORDER — ROSUVASTATIN CALCIUM 40 MG PO TABS: 40.0000 mg | ORAL_TABLET | Freq: Every day | ORAL | 1 refills | Status: DC

## 2017-03-18 MED ORDER — FOLIC ACID 1 MG PO TABS: 1.0000 mg | ORAL_TABLET | Freq: Every day | ORAL | 1 refills | Status: DC

## 2017-03-18 MED ORDER — CLOPIDOGREL BISULFATE 75 MG PO TABS
75.0000 mg | ORAL_TABLET | Freq: Every day | ORAL | 1 refills | Status: DC
Start: 2017-03-18 — End: 2017-03-30

## 2017-03-18 MED ORDER — ISOSORBIDE MONONITRATE ER 30 MG PO TB24: 30.0000 mg | ORAL_TABLET | Freq: Every day | ORAL | 1 refills | Status: DC

## 2017-03-18 MED ORDER — PANTOPRAZOLE SODIUM 40 MG PO TBEC: 40.0000 mg | DELAYED_RELEASE_TABLET | Freq: Every day | ORAL | 1 refills | Status: DC

## 2017-03-18 MED ORDER — RAMIPRIL 2.5 MG PO CAPS: 2.5000 mg | ORAL_CAPSULE | Freq: Every day | ORAL | 1 refills | Status: DC

## 2017-03-18 MED ORDER — METOPROLOL TARTRATE 25 MG PO TABS: 25.0000 mg | ORAL_TABLET | Freq: Two times a day (BID) | ORAL | 1 refills | Status: DC

## 2017-03-18 NOTE — Telephone Encounter (Signed)
Patient scheduled to see MD 05/20/17 Rx(s) sent to pharmacy electronically.

## 2017-03-18 NOTE — Telephone Encounter (Signed)
Pt not seen since 06-2015 needs appt

## 2017-03-24 ENCOUNTER — Emergency Department (HOSPITAL_COMMUNITY)
Admission: EM | Admit: 2017-03-24 | Discharge: 2017-03-24 | Disposition: A | Discharge status: Home / Self Care | Payer: Worker's Compensation | Attending: Emergency Medicine | Admitting: Emergency Medicine

## 2017-03-24 ENCOUNTER — Emergency Department (HOSPITAL_COMMUNITY): Payer: Worker's Compensation

## 2017-03-24 ENCOUNTER — Encounter (HOSPITAL_COMMUNITY): Payer: Self-pay | Admitting: Emergency Medicine

## 2017-03-24 DIAGNOSIS — W010XXA Fall on same level from slipping, tripping and stumbling without subsequent striking against object, initial encounter: Secondary | ICD-10-CM | POA: Insufficient documentation

## 2017-03-24 DIAGNOSIS — Y999 Unspecified external cause status: Secondary | ICD-10-CM | POA: Diagnosis not present

## 2017-03-24 DIAGNOSIS — Y939 Activity, unspecified: Secondary | ICD-10-CM | POA: Diagnosis not present

## 2017-03-24 DIAGNOSIS — F17210 Nicotine dependence, cigarettes, uncomplicated: Secondary | ICD-10-CM | POA: Insufficient documentation

## 2017-03-24 DIAGNOSIS — Z7902 Long term (current) use of antithrombotics/antiplatelets: Secondary | ICD-10-CM | POA: Insufficient documentation

## 2017-03-24 DIAGNOSIS — Z79899 Other long term (current) drug therapy: Secondary | ICD-10-CM | POA: Diagnosis not present

## 2017-03-24 DIAGNOSIS — S42211A Unspecified displaced fracture of surgical neck of right humerus, initial encounter for closed fracture: Secondary | ICD-10-CM | POA: Diagnosis not present

## 2017-03-24 DIAGNOSIS — I10 Essential (primary) hypertension: Secondary | ICD-10-CM | POA: Diagnosis not present

## 2017-03-24 DIAGNOSIS — I2510 Atherosclerotic heart disease of native coronary artery without angina pectoris: Secondary | ICD-10-CM | POA: Insufficient documentation

## 2017-03-24 DIAGNOSIS — S4981XA Other specified injuries of right shoulder and upper arm, initial encounter: Secondary | ICD-10-CM | POA: Diagnosis present

## 2017-03-24 DIAGNOSIS — Y929 Unspecified place or not applicable: Secondary | ICD-10-CM | POA: Insufficient documentation

## 2017-03-24 HISTORY — DX: Acute myocardial infarction, unspecified: I21.9

## 2017-03-24 MED ORDER — DOCUSATE SODIUM 100 MG PO CAPS: 100.0000 mg | ORAL_CAPSULE | Freq: Two times a day (BID) | ORAL | 0 refills | Status: DC

## 2017-03-24 MED ORDER — FENTANYL CITRATE (PF) 100 MCG/2ML IJ SOLN
50.0000 ug | INTRAMUSCULAR | Status: DC | PRN
  Administered 2017-03-24 (×2): 50 ug via INTRAVENOUS
  Filled 2017-03-24 (×2): qty 2

## 2017-03-24 MED ORDER — ONDANSETRON HCL 4 MG/2ML IJ SOLN
4.0000 mg | Freq: Once | INTRAMUSCULAR | Status: AC
  Administered 2017-03-24: 4 mg via INTRAVENOUS
  Filled 2017-03-24: qty 2

## 2017-03-24 MED ORDER — OXYCODONE-ACETAMINOPHEN 5-325 MG PO TABS: 2.0000 | ORAL_TABLET | ORAL | 0 refills | Status: DC | PRN

## 2017-03-24 MED ORDER — FENTANYL CITRATE (PF) 100 MCG/2ML IJ SOLN
100.0000 ug | Freq: Once | INTRAMUSCULAR | Status: AC
  Administered 2017-03-24: 100 ug via INTRAVENOUS
  Filled 2017-03-24: qty 2

## 2017-03-24 MED ORDER — SODIUM CHLORIDE 0.9 % IV BOLUS (SEPSIS)
1000.0000 mL | Freq: Once | INTRAVENOUS | Status: AC
  Administered 2017-03-24: 1000 mL via INTRAVENOUS

## 2017-03-24 NOTE — Discharge Instructions (Signed)
Keep arm in sling. Call Dr. Roda ShuttersXu (Pronounced Deno Etiennehu) this afternoon for Friday or Monday appointment. Tell the office that you were seen in the ER and that Dr.Sherrelle Prochazka spoke with Dr. Roda ShuttersXu about Friday or Monday appointment.

## 2017-03-24 NOTE — ED Triage Notes (Addendum)
Pt stated that she tripped and fell onto r/shouldsdr 30 minutes ago. Pt is unable to move r/arm. Pt was cradling a dog. Impact was to r/shoulderr.R/  Radial pulse present via doppler. Pt denies LOC , denies dizziness prior to fall.

## 2017-03-25 ENCOUNTER — Telehealth (INDEPENDENT_AMBULATORY_CARE_PROVIDER_SITE_OTHER): Payer: Self-pay | Admitting: Orthopaedic Surgery

## 2017-03-25 NOTE — Telephone Encounter (Signed)
Will you be able to refill oxycodone for patient?  See below.

## 2017-03-25 NOTE — Telephone Encounter (Signed)
Patient's sister in law Darel Hong(Judy) called advised patient will be out of medication by tomorrow evening. She asked what else can the patient take for the pain until her Rx for (Oxycodone) can be refilled. Darel HongJudy said patient is scheduled for Monday at 9:15am to see Dr. Roda ShuttersXu.  Darel HongJudy said patient is in a great deal of pain.   The number to contact Raiford NobleRick is [phone] or call the patient  [phone]

## 2017-03-25 NOTE — Telephone Encounter (Signed)
I called discussed. Got 30 percocet. She can add some advil, aspercreme to skin. Continue ice and recliner position. Has appt Monday AM  fyi

## 2017-03-28 ENCOUNTER — Ambulatory Visit (INDEPENDENT_AMBULATORY_CARE_PROVIDER_SITE_OTHER): Payer: Worker's Compensation | Admitting: Orthopaedic Surgery

## 2017-03-28 ENCOUNTER — Encounter (INDEPENDENT_AMBULATORY_CARE_PROVIDER_SITE_OTHER): Payer: Self-pay | Admitting: Orthopaedic Surgery

## 2017-03-28 ENCOUNTER — Telehealth: Payer: Self-pay | Admitting: Cardiology

## 2017-03-28 DIAGNOSIS — S42291A Other displaced fracture of upper end of right humerus, initial encounter for closed fracture: Secondary | ICD-10-CM | POA: Diagnosis not present

## 2017-03-28 MED ORDER — OXYCODONE HCL 5 MG PO TABS: 5.0000 mg | ORAL_TABLET | Freq: Four times a day (QID) | ORAL | 0 refills | Status: DC | PRN

## 2017-03-28 NOTE — Progress Notes (Signed)
Office Visit Note   Patient: [name]           Date of Birth: [birth year]           MRN: [id number] Visit Date: 03/28/2017              Requested by: Gordy Savers, MD [address] [place], [place] [address] PCP: Gordy Savers, MD   Assessment & Plan: Visit Diagnoses:  1. Closed 4-part fracture of proximal humerus, right, initial encounter     Plan: X-rays and CT scans were both reviewed and shows a displaced four-part proximal humerus fracture. Patient is too young for a reverse shoulder arthroplasty. We will attempt ORIF. She understands risks benefits alternatives to surgery including infection, injury to neurovascular structures, need for additional surgery, nonunion. We will get an urgent request from her cardiologist Dr. Antoine Poche for surgical clearance.  Follow-Up Instructions: Return for 2 week postop visit.   Orders:  No orders of the defined types were placed in this encounter.  Meds ordered this encounter  Medications  . oxyCODONE (OXY IR/ROXICODONE) 5 MG immediate release tablet    Sig: Take 1-2 tablets (5-10 mg total) by mouth every 6 (six) hours as needed.    Dispense:  30 tablet    Refill:  0      Procedures: No procedures performed   Clinical Data: No additional findings.   Subjective: Chief Complaint  Patient presents with  . Right Shoulder - Pain    Patient is a 56 year old female who had a mechanical fall and sustained a right proximal humerus fracture. She states she has 10 out of 10 pain. She is currently taking oxycodone. 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 She takes Plavix for her coronary heart disease. This is been stable.    Review of Systems  Constitutional: Negative.   HENT: Negative.   Eyes: Negative.   Respiratory: Negative.   Cardiovascular: Negative.   Endocrine: Negative.   Musculoskeletal: Negative.   Neurological: Negative.   Hematological: Negative.   Psychiatric/Behavioral:  Negative.   All other systems reviewed and are negative.    Objective: Vital Signs: There were no vitals taken for this visit.  Physical Exam  Constitutional: She is oriented to person, place, and time. She appears well-developed and well-nourished.  HENT:  Head: Normocephalic and atraumatic.  Eyes: EOM are normal.  Neck: Neck supple.  Pulmonary/Chest: Effort normal.  Abdominal: Soft.  Neurological: She is alert and oriented to person, place, and time.  Skin: Skin is warm. Capillary refill takes less than 2 seconds.  Psychiatric: She has a normal mood and affect. Her behavior is normal. Judgment and thought content normal.  Nursing note and vitals reviewed.   Ortho Exam Right shoulder exam shows minimal swelling or bruising. She is neurovascular intact. Specialty Comments:  No specialty comments available.  Imaging: No results found.   PMFS History: Patient Active Problem List   Diagnosis Date Noted  . Closed 4-part fracture of proximal humerus, right, initial encounter 03/28/2017  . LEG PAIN 01/23/2010  . TINNITUS, CHRONIC, RIGHT 11/26/2009  . TOBACCO USER 11/18/2009  . OVERWEIGHT/OBESITY 11/10/2009  . HYPERLIPIDEMIA 02/12/2009  . HYPERTENSION 02/12/2009  . CORONARY ARTERY DISEASE 02/12/2009  . GERD 02/12/2009  . SEBACEOUS CYST, INFECTED 02/12/2009  . OTHER SPECIFIED DISORDER OF SKIN 02/12/2009   Past Medical History:  Diagnosis Date  . CAD (coronary artery disease)    cypher stent  . GERD (gastroesophageal reflux disease)   .  Heart attack (HCC)    x 2  . HLD (hyperlipidemia)   . HTN (hypertension)   . Obesity   . Tinnitus     Family History  Problem Relation Age of Onset  . Heart attack Father   . Heart attack Mother     Past Surgical History:  Procedure Laterality Date  . APPENDECTOMY    . CARDIAC CATHETERIZATION     Social History   Occupational History  . mortgage Firefighterloan officer    Social History Main Topics  . Smoking status: Current Every  Day Smoker    Packs/day: 1.00    Types: Cigarettes  . Smokeless tobacco: Never Used  . Alcohol use Yes     Comment: occ  . Drug use: Unknown  . Sexual activity: Not on file

## 2017-03-28 NOTE — Telephone Encounter (Signed)
Error

## 2017-03-30 ENCOUNTER — Encounter: Payer: Self-pay | Admitting: Physician Assistant

## 2017-03-30 ENCOUNTER — Ambulatory Visit (INDEPENDENT_AMBULATORY_CARE_PROVIDER_SITE_OTHER): Payer: Worker's Compensation | Admitting: Physician Assistant

## 2017-03-30 ENCOUNTER — Other Ambulatory Visit (INDEPENDENT_AMBULATORY_CARE_PROVIDER_SITE_OTHER): Payer: Self-pay | Admitting: Orthopaedic Surgery

## 2017-03-30 VITALS — BP 107/71 | HR 60 | Ht 63.0 in | Wt 251.0 lb

## 2017-03-30 DIAGNOSIS — Z720 Tobacco use: Secondary | ICD-10-CM

## 2017-03-30 DIAGNOSIS — E785 Hyperlipidemia, unspecified: Secondary | ICD-10-CM | POA: Diagnosis not present

## 2017-03-30 DIAGNOSIS — S42411S Displaced simple supracondylar fracture without intercondylar fracture of right humerus, sequela: Secondary | ICD-10-CM

## 2017-03-30 DIAGNOSIS — Z01818 Encounter for other preprocedural examination: Secondary | ICD-10-CM

## 2017-03-30 DIAGNOSIS — S42292A Other displaced fracture of upper end of left humerus, initial encounter for closed fracture: Secondary | ICD-10-CM

## 2017-03-30 DIAGNOSIS — I10 Essential (primary) hypertension: Secondary | ICD-10-CM

## 2017-03-30 DIAGNOSIS — I2510 Atherosclerotic heart disease of native coronary artery without angina pectoris: Secondary | ICD-10-CM

## 2017-03-30 MED ORDER — NITROGLYCERIN 0.4 MG SL SUBL: 0.4000 mg | SUBLINGUAL_TABLET | SUBLINGUAL | 3 refills | Status: DC | PRN

## 2017-03-30 MED ORDER — CLOPIDOGREL BISULFATE 75 MG PO TABS: 75.0000 mg | ORAL_TABLET | Freq: Every day | ORAL | 3 refills | Status: DC

## 2017-03-30 MED ORDER — RAMIPRIL 2.5 MG PO CAPS: 2.5000 mg | ORAL_CAPSULE | Freq: Every day | ORAL | 3 refills | Status: DC

## 2017-03-30 MED ORDER — ROSUVASTATIN CALCIUM 40 MG PO TABS: 40.0000 mg | ORAL_TABLET | Freq: Every day | ORAL | 3 refills | Status: DC

## 2017-03-30 MED ORDER — FUROSEMIDE 40 MG PO TABS: 40.0000 mg | ORAL_TABLET | ORAL | 3 refills | Status: DC

## 2017-03-30 MED ORDER — POTASSIUM CHLORIDE ER 10 MEQ PO TBCR: 20.0000 meq | EXTENDED_RELEASE_TABLET | Freq: Three times a day (TID) | ORAL | 3 refills | Status: DC

## 2017-03-30 MED ORDER — ISOSORBIDE MONONITRATE ER 30 MG PO TB24: 30.0000 mg | ORAL_TABLET | Freq: Every day | ORAL | 3 refills | Status: DC

## 2017-03-30 MED ORDER — METOPROLOL TARTRATE 25 MG PO TABS: 25.0000 mg | ORAL_TABLET | Freq: Two times a day (BID) | ORAL | 3 refills | Status: DC

## 2017-03-30 MED ORDER — PANTOPRAZOLE SODIUM 40 MG PO TBEC: 40.0000 mg | DELAYED_RELEASE_TABLET | Freq: Every day | ORAL | 3 refills | Status: DC

## 2017-03-30 NOTE — Progress Notes (Signed)
Cardiology Office Note    Date:  03/30/2017   ID:  [name], DOB 03-05-1961, MRN [id number]  PCP:  Gordy SaversKwiatkowski, Peter F, MD  Cardiologist:  Dr. Antoine PocheHochrein  Chief Complaint  Patient presents with  . Pre-op Exam    seen for Dr. Antoine PocheHochrein. Upcoming R humurus ORIF by Dr. Glee ArvinMichael Xu   Preoperative clearance requested by Dr. Glee ArvinMichael Xu for ORIF of right humerus fracture   History of Present Illness:  [name] is a 56 y.o. female with PMH of GERD, HTN, HLD, and CAD. She suffered her first heart attack at age 56. This was treated with a stent to her left circumflex. Cath in 03/2015 showed in-stent restenosis of the left circumflex as well as 80% lesion in the LAD, first diagonal. She underwent Cypher DES to in-stent restenosis of left circumflex as well as to LAD. She had angioplasty of the second diagonal. Last cardiac catheterization obtained on 05/13/2006 showed no significant coronary artery residual disease. Last lipid panel obtained in our system on 10/26/2013 showed triglyceride 268, LDL 192, cholesterol 272. I have not seen her lipid panel since. She was last seen by Dr. Antoine PocheHochrein on 07/10/2015, she was continued on DAPT. Unfortunately she was still smoking at the time. She had a ETT on 08/26/2015 which showed no significant ST changes, she was only able to achieve 77% of the predicted heart rate as she took her beta blocker that morning.   Patient presents today along with her husband, she has upcoming orthopedic surgery with ORIF of right humerus. She says she was walking on the sidewalk when she tripped over the edge of the sidewalk and fell to the ground. She has significant pain on her right shoulder with comminuted surgical neck fracture of the right humerus. For some reason, her upcoming surgery is listed as ORIF of left proximal humerus proximal fracture, I have verified with physical exam and also on recent image that it was actually the right side that was broken.  Otherwise, she says she has been doing quite well since the last time she has been seen by cartilage service in 2016. She unfortunately continued to smoke at least 2 packs per day since she was 56 years old. We did discuss tobacco cessation. Otherwise she denies any significant chest discomfort, shortness breath, lower extremity edema, orthopnea or paroxysmal nocturnal dyspnea. She can walk at least 4 blocks away from her house and back without any chest discomfort or shortness of breath. She can climb at least 2 flight of stairs without any issues. Given the fact that she can complete at least 4 METS without problem, she is cleared from cardiology perspective to proceed with surgery without any additional workup. She can follow-up with cardiology service in 1 year. I am concerned about her uncontrolled hyperlipidemia, I recommended her to obtain fasting lipid panel and LFTs in 6-8 weeks, if continued to be high, I will refer her to our lipid clinic. Otherwise she will restart Plavix after surgery as soon as possible once deemed low bleeding risk by her surgeon.    Past Medical History:  Diagnosis Date  . CAD (coronary artery disease)    cypher stent  . GERD (gastroesophageal reflux disease)   . Heart attack (HCC)    x 2  . HLD (hyperlipidemia)   . HTN (hypertension)   . Obesity   . Tinnitus     Past Surgical History:  Procedure Laterality Date  . APPENDECTOMY    . CARDIAC  CATHETERIZATION      Current Medications: Outpatient Medications Prior to Visit  Medication Sig Dispense Refill  . docusate sodium (COLACE) 100 MG capsule Take 1 capsule (100 mg total) by mouth every 12 (twelve) hours. 60 capsule 0  . folic acid (FOLVITE) 1 MG tablet Take 1 tablet (1 mg total) by mouth daily. 30 tablet 1  . naproxen sodium (ANAPROX) 220 MG tablet Take 440 mg by mouth 2 (two) times daily with a meal.    . oxyCODONE (OXY IR/ROXICODONE) 5 MG immediate release tablet Take 1-2 tablets (5-10 mg total) by  mouth every 6 (six) hours as needed. 30 tablet 0  . oxyCODONE-acetaminophen (PERCOCET/ROXICET) 5-325 MG tablet Take 2 tablets by mouth every 4 (four) hours as needed. 30 tablet 0  . clopidogrel (PLAVIX) 75 MG tablet Take 1 tablet (75 mg total) by mouth daily. 30 tablet 1  . isosorbide mononitrate (IMDUR) 30 MG 24 hr tablet Take 1 tablet (30 mg total) by mouth daily. 30 tablet 1  . metoprolol tartrate (LOPRESSOR) 25 MG tablet Take 1 tablet (25 mg total) by mouth 2 (two) times daily. 60 tablet 1  . nitroGLYCERIN (NITROSTAT) 0.4 MG SL tablet Place 0.4 mg under the tongue every 5 (five) minutes as needed (chest pain).     . pantoprazole (PROTONIX) 40 MG tablet Take 1 tablet (40 mg total) by mouth daily. 30 tablet 1  . ramipril (ALTACE) 2.5 MG capsule Take 1 capsule (2.5 mg total) by mouth daily. 30 capsule 1  . rosuvastatin (CRESTOR) 40 MG tablet Take 1 tablet (40 mg total) by mouth daily. 30 tablet 1   No facility-administered medications prior to visit.      Allergies:   Iodine   Social History   Social History  . Marital status: Married    Spouse name: N/A  . Number of children: N/A  . Years of education: N/A   Occupational History  . mortgage Firefighter    Social History Main Topics  . Smoking status: Current Every Day Smoker    Packs/day: 1.00    Types: Cigarettes  . Smokeless tobacco: Never Used     Comment: 2 ppd since age 85  . Alcohol use Yes     Comment: occ  . Drug use: Unknown  . Sexual activity: Not Asked   Other Topics Concern  . None   Social History Narrative  . None     Family History:  The patient's family history includes Aortic aneurysm in her mother; Heart attack in her mother; Heart attack (age of onset: 33) in her father.   ROS:   Please see the history of present illness.    ROS All other systems reviewed and are negative.   PHYSICAL EXAM:   VS:  BP 107/71   Pulse 60   Ht 5\' 3"  (1.6 m)   Wt 251 lb (113.9 kg)   BMI 44.46 kg/m    GEN: Well  nourished, well developed, in no acute distress  HEENT: normal  Neck: no JVD, carotid bruits, or masses Cardiac: RRR; no murmurs, rubs, or gallops,no edema  Respiratory:  clear to auscultation bilaterally, normal work of breathing GI: soft, nontender, nondistended, + BS MS: no deformity or atrophy  Skin: warm and dry, no rash Neuro:  Alert and Oriented x 3, Strength and sensation are intact Psych: euthymic mood, full affect  Wt Readings from Last 3 Encounters:  03/30/17 251 lb (113.9 kg)  03/24/17 240 lb (108.9 kg)  07/18/15  240 lb 8 oz (109.1 kg)      Studies/Labs Reviewed:   EKG:  EKG is ordered today.  The ekg ordered today demonstrates Normal sinus rhythm without significant ST-T wave changes.  Recent Labs: No results found for requested labs within last 8760 hours.   Lipid Panel    Component Value Date/Time   CHOL 272 (H) 10/26/2013 1104   TRIG 268.0 (H) 10/26/2013 1104   HDL 40.00 10/26/2013 1104   CHOLHDL 7 10/26/2013 1104   VLDL 53.6 (H) 10/26/2013 1104   LDLCALC 87 05/05/2012 1025   LDLDIRECT 192.8 10/26/2013 1104    Additional studies/ records that were reviewed today include:    Cath 04/13/2005 CONCLUSION:  1.  Successful percutaneous transluminal coronary angioplasty and placement      of a Cypher 3 x 23 mm stent in the mid AV groove circumflex stenotic      lesion.  2.  Mild to moderately depressed left ventricular systolic function.  3.  Adjunct use of Integrilin infusion.  4.  Enrollment in TRITON study.    Cath 11/26/2005  CONCLUSION:  1.  Successful percutaneous transluminal coronary angiography with      intravascular ultrasound imaging and placement of a Cypher 3 by 28 mm      stent in the proximal LAD ultimately post dilated to approximately 3.48      mm.  2.  Successful percutaneous transluminal coronary angiography of the ostial      second diagonal stenotic lesion.  3.  Normal left ventricular systolic function.  4.  Elevated left  ventricular end diastolic pressure.  5.  Angiomax infusion.     Cath 05/13/2006 CONCLUSION:  1. No significant coronary artery disease involving major vessels, though      two small diagonal branches may be the source of chest pain.  2. Normal LV systolic function.  3. No evidence of renal artery stenosis.  4. No evidence of abdominal aneurysm.    ETT 08/26/2015 Study Highlights    There was no ST segment deviation noted during stress.   ETT with fair exercise tolerance (7:56); no chest pain; normal BP response; patient did not achieve THR (77% predicted; took AM beta blocker); no ST changes; negative inadequate ETT; ischemia cannot be excluded at higher work loads.     CT of R shoulder 03/24/2017 IMPRESSION: Comminuted surgical neck fracture of the right humerus with 2.5 cm of foreshortening and approximately 1 shaft width anterior displacement. Nondisplaced component of the fracture extends superiorly into the medial margin of the anterior greater tuberosity.   ASSESSMENT:    1. Preoperative clearance   2. Hyperlipidemia, unspecified hyperlipidemia type   3. Atherosclerosis of native coronary artery of native heart without angina pectoris   4. Closed supracondylar fracture of right humerus, sequela   5. Essential hypertension   6. Tobacco abuse      PLAN:  In order of problems listed above:  1. Preoperative clearance: She can complete at least 4 METS without any exertional symptom. She is cleared to proceed with ORIF of right humerus without any additional workup. She is already holding her Plavix, she needs to restart on Plavix after surgery as soon as deemed low bleeding risk by her surgeon.  2. CAD: No obvious angina, PCI of LAD and left circumflex in the past. Last PCI was in 11/2005, last cardiac catheterization was in 2007 which showed patent stents.  3. Hypertension: Blood pressure well controlled on current medication, I have given her  refill.  4. Hyperlipidemia: She will need a lipid panel in 6-8 weeks, if still high, she will be referred to our lipid clinic.  5. Tobacco abuse: She has been smoking 2 packs per day since she was age 56, I have discussed with her the importance of tobacco cessation, it does not seems she is determined to quit at this time.    Medication Adjustments/Labs and Tests Ordered: Current medicines are reviewed at length with the patient today.  Concerns regarding medicines are outlined above.  Medication changes, Labs and Tests ordered today are listed in the Patient Instructions below. Patient Instructions  Medication Instructions:  1. Your physician recommends that you continue on your current medications as directed. Please refer to the Current Medication list given to you today.   REFILLS HAVE BEEN SENT IN TODAY FOR YOUR CARDIAC MEDICATIONS  Labwork: 05/20/17 FOR FASTING LIPID AND LIVER PANEL  Testing/Procedures: NONE ORDERED TODAY  Follow-Up: Your physician wants you to follow-up in: 1 YEAR WITH DR. HOCHREIN. You will receive a reminder letter in the mail two months in advance. If you don't receive a letter, please call our office to schedule the follow-up appointment.   Any Other Special Instructions Will Be Listed Below (If Applicable).     If you need a refill on your cardiac medications before your next appointment, please call your pharmacy.        Ramond Dial, [place]  03/30/2017 5:11 PM    [facility] [address] [place], [place], [place]  [address] Phone: [phone]; Fax: [phone]

## 2017-03-30 NOTE — Patient Instructions (Addendum)
Medication Instructions:  1. Your physician recommends that you continue on your current medications as directed. Please refer to the Current Medication list given to you today.   REFILLS HAVE BEEN SENT IN TODAY FOR YOUR CARDIAC MEDICATIONS  Labwork: 05/20/17 FOR FASTING LIPID AND LIVER PANEL  Testing/Procedures: NONE ORDERED TODAY  Follow-Up: Your physician wants you to follow-up in: 1 YEAR WITH DR. HOCHREIN. You will receive a reminder letter in the mail two months in advance. If you don't receive a letter, please call our office to schedule the follow-up appointment.   Any Other Special Instructions Will Be Listed Below (If Applicable).     If you need a refill on your cardiac medications before your next appointment, please call your pharmacy.

## 2017-03-30 NOTE — ED Provider Notes (Signed)
[facility]-EMERGENCY DEPT Provider Note   CSN: [id number] Arrival date & time: 03/24/17  0941     History   Chief Complaint Chief Complaint  Patient presents with  . Fall  . Shoulder Pain  . Arm Pain    HPI [name] is a 56 y.o. female.Chief complaint shoulder pain  HPI 56 year old female. Was holding her patent. She fell and has a severe pain in her right shoulder. Cannot move it with regular pain. No strike to the head. No neck or back pain.  Past Medical History:  Diagnosis Date  . CAD (coronary artery disease)    cypher stent  . GERD (gastroesophageal reflux disease)   . Heart attack (HCC)    x 2  . HLD (hyperlipidemia)   . HTN (hypertension)   . Obesity   . Tinnitus     Patient Active Problem List   Diagnosis Date Noted  . Closed 4-part fracture of proximal humerus, right, initial encounter 03/28/2017  . LEG PAIN 01/23/2010  . TINNITUS, CHRONIC, RIGHT 11/26/2009  . TOBACCO USER 11/18/2009  . OVERWEIGHT/OBESITY 11/10/2009  . HYPERLIPIDEMIA 02/12/2009  . HYPERTENSION 02/12/2009  . CORONARY ARTERY DISEASE 02/12/2009  . GERD 02/12/2009  . SEBACEOUS CYST, INFECTED 02/12/2009  . OTHER SPECIFIED DISORDER OF SKIN 02/12/2009    Past Surgical History:  Procedure Laterality Date  . APPENDECTOMY    . CARDIAC CATHETERIZATION      OB History    No data available       Home Medications    Prior to Admission medications   Medication Sig Start Date End Date Taking? Authorizing Provider  clopidogrel (PLAVIX) 75 MG tablet Take 1 tablet (75 mg total) by mouth daily. 03/18/17  Yes Rollene Rotunda, MD  folic acid (FOLVITE) 1 MG tablet Take 1 tablet (1 mg total) by mouth daily. 03/18/17  Yes Rollene Rotunda, MD  isosorbide mononitrate (IMDUR) 30 MG 24 hr tablet Take 1 tablet (30 mg total) by mouth daily. 03/18/17  Yes Rollene Rotunda, MD  metoprolol tartrate (LOPRESSOR) 25 MG tablet Take 1 tablet (25 mg total) by mouth 2 (two) times daily. 03/18/17  Yes Rollene Rotunda, MD  naproxen sodium (ANAPROX) 220 MG tablet Take 440 mg by mouth 2 (two) times daily with a meal.   Yes [provider]  pantoprazole (PROTONIX) 40 MG tablet Take 1 tablet (40 mg total) by mouth daily. 03/18/17  Yes Rollene Rotunda, MD  ramipril (ALTACE) 2.5 MG capsule Take 1 capsule (2.5 mg total) by mouth daily. 03/18/17  Yes Rollene Rotunda, MD  rosuvastatin (CRESTOR) 40 MG tablet Take 1 tablet (40 mg total) by mouth daily. 03/18/17  Yes Rollene Rotunda, MD  docusate sodium (COLACE) 100 MG capsule Take 1 capsule (100 mg total) by mouth every 12 (twelve) hours. 03/24/17   Rolland Porter, MD  nitroGLYCERIN (NITROSTAT) 0.4 MG SL tablet Place 0.4 mg under the tongue every 5 (five) minutes as needed (chest pain).     [provider]  oxyCODONE (OXY IR/ROXICODONE) 5 MG immediate release tablet Take 1-2 tablets (5-10 mg total) by mouth every 6 (six) hours as needed. 03/28/17   Tarry Kos, MD  oxyCODONE-acetaminophen (PERCOCET/ROXICET) 5-325 MG tablet Take 2 tablets by mouth every 4 (four) hours as needed. 03/24/17   Rolland Porter, MD    Family History Family History  Problem Relation Age of Onset  . Heart attack Father   . Heart attack Mother     Social History Social History  Substance Use  Topics  . Smoking status: Current Every Day Smoker    Packs/day: 1.00    Types: Cigarettes  . Smokeless tobacco: Never Used  . Alcohol use Yes     Comment: occ     Allergies   Iodine   Review of Systems Review of Systems  Constitutional: Negative for appetite change, chills, diaphoresis, fatigue and fever.  HENT: Negative for mouth sores, sore throat and trouble swallowing.   Eyes: Negative for visual disturbance.  Respiratory: Negative for cough, chest tightness, shortness of breath and wheezing.   Cardiovascular: Negative for chest pain.  Gastrointestinal: Negative for abdominal distention, abdominal pain, diarrhea, nausea and vomiting.  Endocrine: Negative for polydipsia,  polyphagia and polyuria.  Genitourinary: Negative for dysuria, frequency and hematuria.  Musculoskeletal: Positive for arthralgias. Negative for gait problem.  Skin: Negative for color change, pallor and rash.  Neurological: Negative for dizziness, syncope, light-headedness and headaches.  Hematological: Does not bruise/bleed easily.  Psychiatric/Behavioral: Negative for behavioral problems and confusion.     Physical Exam Updated Vital Signs BP 99/75   Pulse 64   Resp 18   Wt 108.9 kg (240 lb)   SpO2 96%   BMI 42.51 kg/m   Physical Exam  Constitutional: She is oriented to person, place, and time. She appears well-developed and well-nourished. No distress.  HENT:  Head: Normocephalic.  Eyes: Conjunctivae are normal. Pupils are equal, round, and reactive to light. No scleral icterus.  Neck: Normal range of motion. Neck supple. No thyromegaly present.  Cardiovascular: Normal rate and regular rhythm.  Exam reveals no gallop and no friction rub.   No murmur heard. Pulmonary/Chest: Effort normal and breath sounds normal. No respiratory distress. She has no wheezes. She has no rales.  Abdominal: Soft. Bowel sounds are normal. She exhibits no distension. There is no tenderness. There is no rebound.  Musculoskeletal: Normal range of motion.  Soft tissue swelling and tenderness and pain with any movement of the right upper arm at the shoulder. Clinically not dislocated. Nontender over the clavicle and before meals joint. Normal range of motion of elbow and wrist.  Neurological: She is alert and oriented to person, place, and time.  Skin: Skin is warm and dry. No rash noted.  Psychiatric: She has a normal mood and affect. Her behavior is normal.     ED Treatments / Results  Labs (all labs ordered are listed, but only abnormal results are displayed) Labs Reviewed - No data to display  EKG  EKG Interpretation None       Radiology No results found.  Procedures Procedures  (including critical care time)  Medications Ordered in ED Medications  sodium chloride 0.9 % bolus 1,000 mL (0 mLs Intravenous Stopped 03/24/17 1136)  ondansetron (ZOFRAN) injection 4 mg (4 mg Intravenous Given 03/24/17 1029)  fentaNYL (SUBLIMAZE) injection 100 mcg (100 mcg Intravenous Given 03/24/17 1141)     Initial Impression / Assessment and Plan / ED Course  I have reviewed the triage vital signs and the nursing notes.  Pertinent labs & imaging results that were available during my care of the patient were reviewed by me and considered in my medical decision making (see chart for details).    IV placed. Given IV pain medications. X-ray of the shoulder shows surgical neck fracture. I discussed the case with Dr.Xu. He will see the patient in follow-up in clinic. He recommended CT scan of shoulder today this was performed. Patient placed in sling and swath. Also her pain is controlled. Discharge  with Percocet. Will call Dr.Xu tomorrow for follow-up appointment.  Final Clinical Impressions(s) / ED Diagnoses   Final diagnoses:  Closed displaced fracture of surgical neck of right humerus, unspecified fracture morphology, initial encounter    New Prescriptions Discharge Medication List as of 03/24/2017  1:54 PM    START taking these medications   Details  docusate sodium (COLACE) 100 MG capsule Take 1 capsule (100 mg total) by mouth every 12 (twelve) hours., Starting Thu 03/24/2017, Print    oxyCODONE-acetaminophen (PERCOCET/ROXICET) 5-325 MG tablet Take 2 tablets by mouth every 4 (four) hours as needed., Starting Thu 03/24/2017, Print         Rolland PorterJames, Vannie Hochstetler, MD 03/30/17 1306

## 2017-03-31 ENCOUNTER — Encounter (HOSPITAL_COMMUNITY): Payer: Self-pay | Admitting: *Deleted

## 2017-03-31 NOTE — Progress Notes (Signed)
Pt denies SOB and chest pain. Pt under the care of Dr. Antoine PocheHochrein, Cardiology. Pt denies having an echo. Pt denies recent labs. Pt denies having a chest x ary within the last year. Pt stated that she does not take Aspirin and her last dose of Plavix was Monday as instructed by MD. Pt made aware to stop taking vitamins, fish oil and herbal medications. Do not take any NSAIDs ie: Ibuprofen, Advil, Naproxen (Anaprox/Aleve), Motrin, BC and Goody Powder. Pt has cardiac clearance note on chart.

## 2017-04-01 ENCOUNTER — Encounter (HOSPITAL_COMMUNITY): Payer: Self-pay

## 2017-04-01 ENCOUNTER — Encounter (HOSPITAL_COMMUNITY)
Admission: RE | Disposition: A | Discharge status: Home / Self Care | Payer: Self-pay | Source: Ambulatory Visit | Attending: Orthopaedic Surgery

## 2017-04-01 ENCOUNTER — Ambulatory Visit (HOSPITAL_COMMUNITY)
Admission: RE | Admit: 2017-04-01 | Discharge: 2017-04-01 | Disposition: A | Discharge status: Home / Self Care | Payer: Worker's Compensation | Source: Ambulatory Visit | Attending: Orthopaedic Surgery | Admitting: Orthopaedic Surgery

## 2017-04-01 ENCOUNTER — Other Ambulatory Visit (INDEPENDENT_AMBULATORY_CARE_PROVIDER_SITE_OTHER): Payer: Self-pay | Admitting: Orthopaedic Surgery

## 2017-04-01 ENCOUNTER — Ambulatory Visit (HOSPITAL_COMMUNITY): Payer: Worker's Compensation | Admitting: Certified Registered Nurse Anesthetist

## 2017-04-01 ENCOUNTER — Ambulatory Visit (HOSPITAL_COMMUNITY): Payer: Worker's Compensation

## 2017-04-01 DIAGNOSIS — F17210 Nicotine dependence, cigarettes, uncomplicated: Secondary | ICD-10-CM | POA: Insufficient documentation

## 2017-04-01 DIAGNOSIS — X58XXXA Exposure to other specified factors, initial encounter: Secondary | ICD-10-CM | POA: Diagnosis not present

## 2017-04-01 DIAGNOSIS — S42201A Unspecified fracture of upper end of right humerus, initial encounter for closed fracture: Secondary | ICD-10-CM | POA: Diagnosis not present

## 2017-04-01 DIAGNOSIS — E785 Hyperlipidemia, unspecified: Secondary | ICD-10-CM | POA: Insufficient documentation

## 2017-04-01 DIAGNOSIS — K219 Gastro-esophageal reflux disease without esophagitis: Secondary | ICD-10-CM | POA: Insufficient documentation

## 2017-04-01 DIAGNOSIS — I10 Essential (primary) hypertension: Secondary | ICD-10-CM | POA: Diagnosis not present

## 2017-04-01 DIAGNOSIS — S42291A Other displaced fracture of upper end of right humerus, initial encounter for closed fracture: Secondary | ICD-10-CM | POA: Diagnosis not present

## 2017-04-01 DIAGNOSIS — I252 Old myocardial infarction: Secondary | ICD-10-CM | POA: Insufficient documentation

## 2017-04-01 DIAGNOSIS — Z7902 Long term (current) use of antithrombotics/antiplatelets: Secondary | ICD-10-CM | POA: Insufficient documentation

## 2017-04-01 DIAGNOSIS — Z79899 Other long term (current) drug therapy: Secondary | ICD-10-CM | POA: Insufficient documentation

## 2017-04-01 DIAGNOSIS — Z955 Presence of coronary angioplasty implant and graft: Secondary | ICD-10-CM | POA: Diagnosis not present

## 2017-04-01 DIAGNOSIS — S42292A Other displaced fracture of upper end of left humerus, initial encounter for closed fracture: Secondary | ICD-10-CM

## 2017-04-01 DIAGNOSIS — Z419 Encounter for procedure for purposes other than remedying health state, unspecified: Secondary | ICD-10-CM

## 2017-04-01 DIAGNOSIS — I2510 Atherosclerotic heart disease of native coronary artery without angina pectoris: Secondary | ICD-10-CM | POA: Insufficient documentation

## 2017-04-01 DIAGNOSIS — Z6841 Body Mass Index (BMI) 40.0 and over, adult: Secondary | ICD-10-CM | POA: Diagnosis not present

## 2017-04-01 HISTORY — DX: Headache: R51

## 2017-04-01 HISTORY — DX: Unspecified fracture of shaft of humerus, unspecified arm, initial encounter for closed fracture: S42.309A

## 2017-04-01 HISTORY — DX: Headache, unspecified: R51.9

## 2017-04-01 HISTORY — PX: ORIF HUMERUS FRACTURE: SHX2126

## 2017-04-01 LAB — CBC
HCT: 40.5 % (ref 36.0–46.0)
HEMOGLOBIN: 13.6 g/dL (ref 12.0–15.0)
MCH: 33.6 pg (ref 26.0–34.0)
MCHC: 33.6 g/dL (ref 30.0–36.0)
MCV: 100 fL (ref 78.0–100.0)
PLATELETS: 228 10*3/uL (ref 150–400)
RBC: 4.05 MIL/uL (ref 3.87–5.11)
RDW: 12.7 % (ref 11.5–15.5)
WBC: 7.1 10*3/uL (ref 4.0–10.5)

## 2017-04-01 LAB — BASIC METABOLIC PANEL
Anion gap: 11 (ref 5–15)
BUN: 15 mg/dL (ref 6–20)
CALCIUM: 9.5 mg/dL (ref 8.9–10.3)
CO2: 21 mmol/L — ABNORMAL LOW (ref 22–32)
CREATININE: 0.88 mg/dL (ref 0.44–1.00)
Chloride: 102 mmol/L (ref 101–111)
GFR calc Af Amer: >60 mL/min (ref >60)
GFR calc non Af Amer: >60 mL/min (ref >60)
Glucose, Bld: 94 mg/dL (ref 65–99)
Potassium: 4.1 mmol/L (ref 3.5–5.1)
SODIUM: 134 mmol/L — AB (ref 135–145)

## 2017-04-01 SURGERY — OPEN REDUCTION INTERNAL FIXATION (ORIF) PROXIMAL HUMERUS FRACTURE
Anesthesia: Regional | Site: Arm Upper | Laterality: Right

## 2017-04-01 MED ORDER — FENTANYL CITRATE (PF) 100 MCG/2ML IJ SOLN
INTRAMUSCULAR | Status: AC
  Administered 2017-04-01: 100 ug via INTRAVENOUS
  Filled 2017-04-01: qty 2

## 2017-04-01 MED ORDER — ONDANSETRON HCL 4 MG/2ML IJ SOLN
INTRAMUSCULAR | Status: DC | PRN
  Administered 2017-04-01: 4 mg via INTRAVENOUS

## 2017-04-01 MED ORDER — ONDANSETRON HCL 4 MG/2ML IJ SOLN
INTRAMUSCULAR | Status: AC
  Filled 2017-04-01: qty 2

## 2017-04-01 MED ORDER — LIDOCAINE HCL (CARDIAC) 20 MG/ML IV SOLN
INTRAVENOUS | Status: AC
  Filled 2017-04-01: qty 5

## 2017-04-01 MED ORDER — PHENYLEPHRINE HCL 10 MG/ML IJ SOLN
INTRAVENOUS | Status: DC | PRN
  Administered 2017-04-01: 20 ug/min via INTRAVENOUS

## 2017-04-01 MED ORDER — DEXAMETHASONE SODIUM PHOSPHATE 10 MG/ML IJ SOLN
INTRAMUSCULAR | Status: DC | PRN
  Administered 2017-04-01: 10 mg via INTRAVENOUS

## 2017-04-01 MED ORDER — BUPIVACAINE-EPINEPHRINE (PF) 0.5% -1:200000 IJ SOLN
INTRAMUSCULAR | Status: DC | PRN
  Administered 2017-04-01: 30 mL via PERINEURAL

## 2017-04-01 MED ORDER — CALCIUM CARBONATE-VITAMIN D 500-200 MG-UNIT PO TABS: 1.0000 | ORAL_TABLET | Freq: Three times a day (TID) | ORAL | 12 refills | Status: DC

## 2017-04-01 MED ORDER — MIDAZOLAM HCL 2 MG/2ML IJ SOLN
INTRAMUSCULAR | Status: AC
  Administered 2017-04-01: 2 mg via INTRAVENOUS
  Filled 2017-04-01: qty 2

## 2017-04-01 MED ORDER — CEFAZOLIN SODIUM-DEXTROSE 2-4 GM/100ML-% IV SOLN
INTRAVENOUS | Status: AC
  Filled 2017-04-01: qty 100

## 2017-04-01 MED ORDER — DEXAMETHASONE SODIUM PHOSPHATE 10 MG/ML IJ SOLN
INTRAMUSCULAR | Status: AC
  Filled 2017-04-01: qty 1

## 2017-04-01 MED ORDER — CEFAZOLIN SODIUM-DEXTROSE 2-4 GM/100ML-% IV SOLN
2.0000 g | INTRAVENOUS | Status: AC
  Administered 2017-04-01: 2 g via INTRAVENOUS

## 2017-04-01 MED ORDER — BUPIVACAINE HCL (PF) 0.25 % IJ SOLN
INTRAMUSCULAR | Status: AC
  Filled 2017-04-01: qty 30

## 2017-04-01 MED ORDER — MIDAZOLAM HCL 2 MG/2ML IJ SOLN
INTRAMUSCULAR | Status: AC
  Filled 2017-04-01: qty 2

## 2017-04-01 MED ORDER — SUGAMMADEX SODIUM 200 MG/2ML IV SOLN
INTRAVENOUS | Status: AC
  Filled 2017-04-01: qty 2

## 2017-04-01 MED ORDER — PHENYLEPHRINE 40 MCG/ML (10ML) SYRINGE FOR IV PUSH (FOR BLOOD PRESSURE SUPPORT)
PREFILLED_SYRINGE | INTRAVENOUS | Status: DC | PRN
  Administered 2017-04-01 (×2): 120 ug via INTRAVENOUS

## 2017-04-01 MED ORDER — FENTANYL CITRATE (PF) 100 MCG/2ML IJ SOLN
INTRAMUSCULAR | Status: DC | PRN
  Administered 2017-04-01: 100 ug via INTRAVENOUS

## 2017-04-01 MED ORDER — METHOCARBAMOL 750 MG PO TABS: 750.0000 mg | ORAL_TABLET | Freq: Two times a day (BID) | ORAL | 0 refills | Status: DC | PRN

## 2017-04-01 MED ORDER — PROPOFOL 10 MG/ML IV BOLUS
INTRAVENOUS | Status: AC
  Filled 2017-04-01: qty 40

## 2017-04-01 MED ORDER — METOPROLOL TARTRATE 12.5 MG HALF TABLET
25.0000 mg | ORAL_TABLET | Freq: Once | ORAL | Status: AC
  Administered 2017-04-01: 25 mg via ORAL

## 2017-04-01 MED ORDER — ROCURONIUM BROMIDE 50 MG/5ML IV SOLN
INTRAVENOUS | Status: AC
  Filled 2017-04-01: qty 2

## 2017-04-01 MED ORDER — OXYCODONE HCL 5 MG PO TABS: 5.0000 mg | ORAL_TABLET | ORAL | 0 refills | Status: DC | PRN

## 2017-04-01 MED ORDER — LIDOCAINE 2% (20 MG/ML) 5 ML SYRINGE
INTRAMUSCULAR | Status: DC | PRN
  Administered 2017-04-01: 60 mg via INTRAVENOUS

## 2017-04-01 MED ORDER — ROCURONIUM BROMIDE 100 MG/10ML IV SOLN
INTRAVENOUS | Status: DC | PRN
  Administered 2017-04-01: 10 mg via INTRAVENOUS
  Administered 2017-04-01: 50 mg via INTRAVENOUS
  Administered 2017-04-01: 10 mg via INTRAVENOUS

## 2017-04-01 MED ORDER — 0.9 % SODIUM CHLORIDE (POUR BTL) OPTIME
TOPICAL | Status: DC | PRN
  Administered 2017-04-01: 1000 mL

## 2017-04-01 MED ORDER — PROMETHAZINE HCL 25 MG PO TABS: 25.0000 mg | ORAL_TABLET | Freq: Four times a day (QID) | ORAL | 1 refills | Status: DC | PRN

## 2017-04-01 MED ORDER — ZINC SULFATE 220 (50 ZN) MG PO CAPS: 220.0000 mg | ORAL_CAPSULE | Freq: Every day | ORAL | 0 refills | Status: DC

## 2017-04-01 MED ORDER — PROPOFOL 10 MG/ML IV BOLUS
INTRAVENOUS | Status: DC | PRN
  Administered 2017-04-01: 120 mg via INTRAVENOUS
  Administered 2017-04-01: 10 mg via INTRAVENOUS

## 2017-04-01 MED ORDER — FENTANYL CITRATE (PF) 250 MCG/5ML IJ SOLN
INTRAMUSCULAR | Status: AC
  Filled 2017-04-01: qty 5

## 2017-04-01 MED ORDER — METOPROLOL TARTRATE 12.5 MG HALF TABLET
ORAL_TABLET | ORAL | Status: AC
  Administered 2017-04-01: 25 mg via ORAL
  Filled 2017-04-01: qty 2

## 2017-04-01 MED ORDER — SENNOSIDES-DOCUSATE SODIUM 8.6-50 MG PO TABS: 1.0000 | ORAL_TABLET | Freq: Every evening | ORAL | 1 refills | Status: DC | PRN

## 2017-04-01 MED ORDER — SUGAMMADEX SODIUM 200 MG/2ML IV SOLN
INTRAVENOUS | Status: DC | PRN
  Administered 2017-04-01: 200 mg via INTRAVENOUS

## 2017-04-01 MED ORDER — MIDAZOLAM HCL 2 MG/2ML IJ SOLN
2.0000 mg | Freq: Once | INTRAMUSCULAR | Status: AC
  Administered 2017-04-01: 2 mg via INTRAVENOUS

## 2017-04-01 MED ORDER — LACTATED RINGERS IV SOLN
INTRAVENOUS | Status: DC
  Administered 2017-04-01 (×3): via INTRAVENOUS

## 2017-04-01 MED ORDER — FENTANYL CITRATE (PF) 100 MCG/2ML IJ SOLN
100.0000 ug | Freq: Once | INTRAMUSCULAR | Status: AC
  Administered 2017-04-01: 100 ug via INTRAVENOUS

## 2017-04-01 MED ORDER — OXYCODONE HCL ER 10 MG PO T12A: 10.0000 mg | EXTENDED_RELEASE_TABLET | Freq: Two times a day (BID) | ORAL | 0 refills | Status: DC

## 2017-04-01 SURGICAL SUPPLY — 47 items
BIT DRILL 3.2 (BIT) ×2
BIT DRILL 3.2XCALB NS DISP (BIT) IMPLANT
BIT DRILL CALIBRATED 2.7 (BIT) ×1 IMPLANT
BIT DRL 3.2XCALB NS DISP (BIT) ×1
COVER SURGICAL LIGHT HANDLE (MISCELLANEOUS) ×2 IMPLANT
DRAPE C-ARM 42X72 X-RAY (DRAPES) ×2 IMPLANT
DRAPE IMP U-DRAPE 54X76 (DRAPES) ×2 IMPLANT
DRAPE U-SHAPE 47X51 STRL (DRAPES) ×2 IMPLANT
DRSG AQUACEL AG ADV 3.5X14 (GAUZE/BANDAGES/DRESSINGS) ×1 IMPLANT
ELECT CAUTERY BLADE 6.4 (BLADE) ×2 IMPLANT
ELECT REM PT RETURN 9FT ADLT (ELECTROSURGICAL) ×2
ELECTRODE REM PT RTRN 9FT ADLT (ELECTROSURGICAL) ×1 IMPLANT
GLOVE BIOGEL PI IND STRL 8.5 (GLOVE) IMPLANT
GLOVE BIOGEL PI INDICATOR 8.5 (GLOVE) ×1
GLOVE SKINSENSE NS SZ7.5 (GLOVE) ×2
GLOVE SKINSENSE STRL SZ7.5 (GLOVE) ×2 IMPLANT
GOWN STRL REIN XL XLG (GOWN DISPOSABLE) ×4 IMPLANT
K-WIRE 2X5 SS THRDED S3 (WIRE) ×2
KIT BASIN OR (CUSTOM PROCEDURE TRAY) ×2 IMPLANT
KIT ROOM TURNOVER OR (KITS) ×2 IMPLANT
KWIRE 2X5 SS THRDED S3 (WIRE) IMPLANT
NS IRRIG 1000ML POUR BTL (IV SOLUTION) ×2 IMPLANT
PACK SHOULDER (CUSTOM PROCEDURE TRAY) ×2 IMPLANT
PACK UNIVERSAL I (CUSTOM PROCEDURE TRAY) ×2 IMPLANT
PAD ARMBOARD 7.5X6 YLW CONV (MISCELLANEOUS) ×4 IMPLANT
PEG LOCKING 3.2MMX24MM (Peg) ×1 IMPLANT
PEG LOCKING 3.2MMX44 (Peg) ×1 IMPLANT
PEG LOCKING 3.2MMX46 (Peg) ×1 IMPLANT
PEG LOCKING 3.2X34 (Screw) ×1 IMPLANT
PEG LOCKING 3.2X40 (Peg) ×1 IMPLANT
PEG LOCKING 3.2X52 (Peg) ×1 IMPLANT
PLATE PROX HUM HI R 4H 90 (Plate) ×1 IMPLANT
PUTTY DBM STAGRAFT PLUS 5CC (Putty) ×1 IMPLANT
SCREW LOCK CORT STAR 3.5X24 (Screw) ×1 IMPLANT
SCREW LOW PROF TIS 3.5X28MM (Screw) ×2 IMPLANT
SCREW LP NL T15 3.5X26 (Screw) ×1 IMPLANT
SCREW PEG LOCK 3.2X30MM (Screw) ×1 IMPLANT
SLEEVE MEASURING 3.2 (BIT) ×1 IMPLANT
SLING ARM IMMOBILIZER LRG (SOFTGOODS) ×1 IMPLANT
SPONGE LAP 18X18 X RAY DECT (DISPOSABLE) ×3 IMPLANT
SUT ETHILON 3 0 PS 1 (SUTURE) ×3 IMPLANT
SUT MAXBRAID (SUTURE) ×3 IMPLANT
SUT VIC AB 0 CT1 27 (SUTURE) ×2
SUT VIC AB 0 CT1 27XBRD ANBCTR (SUTURE) ×2 IMPLANT
SUT VIC AB 2-0 CT1 27 (SUTURE) ×2
SUT VIC AB 2-0 CT1 TAPERPNT 27 (SUTURE) ×2 IMPLANT
TOWEL OR 17X26 10 PK STRL BLUE (TOWEL DISPOSABLE) ×2 IMPLANT

## 2017-04-01 NOTE — Anesthesia Postprocedure Evaluation (Signed)
Anesthesia Post Note  Patient: [name]  Procedure(s) Performed: Procedure(s) (LRB): OPEN REDUCTION INTERNAL FIXATION (ORIF) RIGHT PROXIMAL HUMERUS FRACTURE (Right)     Patient location during evaluation: PACU Anesthesia Type: Regional Level of consciousness: awake Pain management: pain level controlled Vital Signs Assessment: post-procedure vital signs reviewed and stable Respiratory status: spontaneous breathing Cardiovascular status: stable Postop Assessment: no signs of nausea or vomiting Anesthetic complications: no    Last Vitals:  Vitals:   04/01/17 1500 04/01/17 1525  BP: 107/78   Pulse:    Resp:    Temp: (!) 36.4 C 36.5 C    Last Pain:  Vitals:   04/01/17 1045  TempSrc: Oral  PainSc: 8    Pain Goal: Patients Stated Pain Goal: 5 (04/01/17 1045)               [name],[name]

## 2017-04-01 NOTE — H&P (Signed)
PREOPERATIVE H&P  Chief Complaint: Right Proximal Humerus Fracture  HPI: [name] is a 56 y.o. female who presents for surgical treatment of Right Proximal Humerus Fracture.  She denies any changes in medical history.  Past Medical History:  Diagnosis Date  . CAD (coronary artery disease)    cypher stent  . GERD (gastroesophageal reflux disease)   . Headache   . Heart attack (HCC)    x 2  . HLD (hyperlipidemia)   . HTN (hypertension)   . Humerus fracture   . Obesity   . Tinnitus    Past Surgical History:  Procedure Laterality Date  . APPENDECTOMY    . CARDIAC CATHETERIZATION    . coronary stents     x 4  . EYE SURGERY     lasik  . FRACTURE SURGERY     left ankle  . MULTIPLE TOOTH EXTRACTIONS     Social History   Social History  . Marital status: Married    Spouse name: N/A  . Number of children: N/A  . Years of education: N/A   Occupational History  . mortgage Firefighterloan officer    Social History Main Topics  . Smoking status: Current Every Day Smoker    Packs/day: 2.00    Types: Cigarettes  . Smokeless tobacco: Never Used     Comment: 2 ppd since age 56  . Alcohol use Yes     Comment: occ  . Drug use: No  . Sexual activity: Not Asked   Other Topics Concern  . None   Social History Narrative  . None   Family History  Problem Relation Age of Onset  . Heart attack Father 1730       died at age 55  . Heart attack Mother   . Aortic aneurysm Mother        died of complication after surgery   Allergies  Allergen Reactions  . Iodine Swelling and Rash    SWELLING REACTION UNSPECIFIED      Prior to Admission medications   Medication Sig Start Date End Date Taking? Authorizing Provider  docusate sodium (COLACE) 100 MG capsule Take 1 capsule (100 mg total) by mouth every 12 (twelve) hours. 03/24/17  Yes Rolland PorterJames, Mark, MD  folic acid (FOLVITE) 1 MG tablet Take 1 tablet (1 mg total) by mouth daily. 03/18/17  Yes Rollene RotundaHochrein, James, MD  isosorbide  mononitrate (IMDUR) 30 MG 24 hr tablet Take 1 tablet (30 mg total) by mouth daily. 03/30/17  Yes Azalee CourseMeng, Hao, PA  metoprolol tartrate (LOPRESSOR) 25 MG tablet Take 1 tablet (25 mg total) by mouth 2 (two) times daily. 03/30/17  Yes Azalee CourseMeng, Hao, PA  naproxen sodium (ANAPROX) 220 MG tablet Take 440 mg by mouth 2 (two) times daily with a meal.   Yes [provider]  oxyCODONE (OXY IR/ROXICODONE) 5 MG immediate release tablet Take 1-2 tablets (5-10 mg total) by mouth every 6 (six) hours as needed. 03/28/17  Yes Tarry KosXu, Mark Benecke M, MD  pantoprazole (PROTONIX) 40 MG tablet Take 1 tablet (40 mg total) by mouth daily. 03/30/17  Yes Azalee CourseMeng, Hao, PA  ramipril (ALTACE) 2.5 MG capsule Take 1 capsule (2.5 mg total) by mouth daily. 03/30/17  Yes Azalee CourseMeng, Hao, PA  rosuvastatin (CRESTOR) 40 MG tablet Take 1 tablet (40 mg total) by mouth daily. 03/30/17  Yes Meng, Wynema BirchHao, PA  calcium-vitamin D (OSCAL WITH D) 500-200 MG-UNIT tablet Take 1 tablet by mouth 3 (three) times daily. 04/01/17   Roda ShuttersXu,  Edwin Cap, MD  clopidogrel (PLAVIX) 75 MG tablet Take 1 tablet (75 mg total) by mouth daily. 03/30/17   Azalee Course, PA  methocarbamol (ROBAXIN) 750 MG tablet Take 1 tablet (750 mg total) by mouth 2 (two) times daily as needed for muscle spasms. 04/01/17   Tarry Kos, MD  nitroGLYCERIN (NITROSTAT) 0.4 MG SL tablet Place 1 tablet (0.4 mg total) under the tongue every 5 (five) minutes as needed (chest pain). 03/30/17   Azalee Course, PA  oxyCODONE (OXY IR/ROXICODONE) 5 MG immediate release tablet Take 1-3 tablets (5-15 mg total) by mouth every 4 (four) hours as needed. 04/01/17   Tarry Kos, MD  oxyCODONE (OXYCONTIN) 10 mg 12 hr tablet Take 1 tablet (10 mg total) by mouth every 12 (twelve) hours. 04/01/17   Tarry Kos, MD  oxyCODONE-acetaminophen (PERCOCET/ROXICET) 5-325 MG tablet Take 2 tablets by mouth every 4 (four) hours as needed. 03/24/17   Rolland Porter, MD  promethazine (PHENERGAN) 25 MG tablet Take 1 tablet (25 mg total) by mouth every 6 (six)  hours as needed for nausea. 04/01/17   Tarry Kos, MD  senna-docusate (SENOKOT S) 8.6-50 MG tablet Take 1 tablet by mouth at bedtime as needed. 04/01/17   Tarry Kos, MD  zinc sulfate 220 (50 Zn) MG capsule Take 1 capsule (220 mg total) by mouth daily. 04/01/17   Tarry Kos, MD     Positive ROS: All other systems have been reviewed and were otherwise negative with the exception of those mentioned in the HPI and as above.  Physical Exam: General: Alert, no acute distress Cardiovascular: No pedal edema Respiratory: No cyanosis, no use of accessory musculature GI: abdomen soft Skin: No lesions in the area of chief complaint Neurologic: Sensation intact distally Psychiatric: Patient is competent for consent with normal mood and affect Lymphatic: no lymphedema  MUSCULOSKELETAL: exam stable  Assessment: Right Proximal Humerus Fracture  Plan: Plan for Procedure(s): OPEN REDUCTION INTERNAL FIXATION (ORIF) RIGHT PROXIMAL HUMERUS FRACTURE  The risks benefits and alternatives were discussed with the patient including but not limited to the risks of nonoperative treatment, versus surgical intervention including infection, bleeding, nerve injury,  blood clots, cardiopulmonary complications, morbidity, mortality, among others, and they were willing to proceed.   Glee Arvin, MD   04/01/2017 3:03 PM

## 2017-04-01 NOTE — Anesthesia Procedure Notes (Signed)
Procedure Name: Intubation Date/Time: 04/01/2017 12:40 PM Performed by: Trixie Deis A Pre-anesthesia Checklist: Patient identified, Emergency Drugs available, Suction available and Patient being monitored Patient Re-evaluated:Patient Re-evaluated prior to induction Oxygen Delivery Method: Circle System Utilized Preoxygenation: Pre-oxygenation with 100% oxygen Induction Type: IV induction Ventilation: Mask ventilation without difficulty Laryngoscope Size: Mac and 3 Grade View: Grade I Tube type: Oral Tube size: 7.0 mm Number of attempts: 1 Airway Equipment and Method: Stylet and Oral airway Placement Confirmation: ETT inserted through vocal cords under direct vision,  positive ETCO2 and breath sounds checked- equal and bilateral Secured at: 21 cm Tube secured with: Tape Dental Injury: Teeth and Oropharynx as per pre-operative assessment

## 2017-04-01 NOTE — Anesthesia Preprocedure Evaluation (Signed)
Anesthesia Evaluation  Patient identified by MRN, date of birth, ID band Patient awake    Reviewed: Allergy & Precautions, NPO status , Patient's Chart, lab work & pertinent test results  History of Anesthesia Complications Negative for: history of anesthetic complications  Airway Mallampati: III  TM Distance: >3 FB Neck ROM: Full    Dental  (+) Teeth Intact   Pulmonary neg shortness of breath, neg sleep apnea, neg COPD, neg recent URI, Current Smoker,    breath sounds clear to auscultation       Cardiovascular hypertension, Pt. on medications and Pt. on home beta blockers + CAD, + Past MI and + Cardiac Stents   Rhythm:Regular     Neuro/Psych  Headaches, neg Seizures negative psych ROS   GI/Hepatic Neg liver ROS, GERD  Medicated and Controlled,  Endo/Other  Morbid obesity  Renal/GU negative Renal ROS     Musculoskeletal Right humerus fracture   Abdominal   Peds  Hematology negative hematology ROS (+)   Anesthesia Other Findings Cath in 03/2005 showed in-stent restenosis of the left circumflex as well as 80% lesion in the LAD, first diagonal. She underwent Cypher DES to in-stent restenosis of left circumflex as well as to LAD. She had angioplasty of the second diagonal. Last cardiac catheterization obtained on 05/13/2006 showed no significant coronary artery residual disease  Saw cardiology with plavix held, no asa per cardiologist and cleared for surgery. Denies recent CP or changes in activity level  Reproductive/Obstetrics                             Anesthesia Physical Anesthesia Plan  ASA: III  Anesthesia Plan: General and Regional   Post-op Pain Management:  Regional for Post-op pain   Induction: Intravenous  PONV Risk Score and Plan: 2 and Ondansetron, Dexamethasone and Treatment may vary due to age or medical condition  Airway Management Planned: Oral ETT  Additional  Equipment: None  Intra-op Plan:   Post-operative Plan: Extubation in OR  Informed Consent: I have reviewed the patients History and Physical, chart, labs and discussed the procedure including the risks, benefits and alternatives for the proposed anesthesia with the patient or authorized representative who has indicated his/her understanding and acceptance.   Dental advisory given  Plan Discussed with: CRNA and Surgeon  Anesthesia Plan Comments:         Anesthesia Quick Evaluation

## 2017-04-01 NOTE — Anesthesia Procedure Notes (Signed)
Anesthesia Regional Block: Interscalene brachial plexus block   Pre-Anesthetic Checklist: ,, timeout performed, Correct Patient, Correct Site, Correct Laterality, Correct Procedure, Correct Position, site marked, Risks and benefits discussed,  Surgical consent,  Pre-op evaluation,  At surgeon's request and post-op pain management  Laterality: Upper and Right  Prep: chloraprep       Needles:  Injection technique: Single-shot  Needle Type: Echogenic Stimulator Needle          Additional Needles:   Procedures: ultrasound guided, nerve stimulator,,,,,,   Nerve Stimulator or Paresthesia:  Response: deltoid, 0.5 mA,   Additional Responses:   Narrative:  Start time: 04/01/2017 11:55 AM End time: 04/01/2017 11:59 AM Injection made incrementally with aspirations every 5 mL.  Performed by: Personally  Anesthesiologist: Devesh Monforte  Additional Notes: H+P and labs reviewed, risks and benefits discussed with patient, procedure tolerated well without complications

## 2017-04-01 NOTE — Discharge Instructions (Signed)
Postoperative instructions:  Weightbearing instructions: non weight bearing. Wear sling at all times  Dressing instructions: Keep your dressing and/or splint clean and dry at all times.  It will be removed at your first post-operative appointment.  Your stitches and/or staples will be removed at this visit.  Incision instructions:  Do not soak your incision for 3 weeks after surgery.  If the incision gets wet, pat dry and do not scrub the incision.  Pain control:  You have been given a prescription to be taken as directed for post-operative pain control.  In addition, elevate the operative extremity above the heart at all times to prevent swelling and throbbing pain.  Take over-the-counter Colace, 100mg  by mouth twice a day while taking narcotic pain medications to help prevent constipation.  Follow up appointments: 1) 10-14 days for suture removal and wound check. 2) Dr. Roda ShuttersXu as scheduled.   -------------------------------------------------------------------------------------------------------------  After Surgery Pain Control:  After your surgery, post-surgical discomfort or pain is likely. This discomfort can last several days to a few weeks. At certain times of the day your discomfort may be more intense.  Did you receive a nerve block?  A nerve block can provide pain relief for one hour to two days after your surgery. As long as the nerve block is working, you will experience little or no sensation in the area the surgeon operated on.  As the nerve block wears off, you will begin to experience pain or discomfort. It is very important that you begin taking your prescribed pain medication before the nerve block fully wears off. Treating your pain at the first sign of the block wearing off will ensure your pain is better controlled and more tolerable when full-sensation returns. Do not wait until the pain is intolerable, as the medicine will be less effective. It is better to treat pain in  advance than to try and catch up.  General Anesthesia:  If you did not receive a nerve block during your surgery, you will need to start taking your pain medication shortly after your surgery and should continue to do so as prescribed by your surgeon.  Pain Medication:  Most commonly we prescribe Vicodin and Percocet for post-operative pain. Both of these medications contain a combination of acetaminophen (Tylenol) and a narcotic to help control pain.   It takes between 30 and 45 minutes before pain medication starts to work. It is important to take your medication before your pain level gets too intense.   Nausea is a common side effect of many pain medications. You will want to eat something before taking your pain medicine to help prevent nausea.   If you are taking a prescription pain medication that contains acetaminophen, we recommend that you do not take additional over the counter acetaminophen (Tylenol).  Other pain relieving options:   Using a cold pack to ice the affected area a few times a day (15 to 20 minutes at a time) can help to relieve pain, reduce swelling and bruising.   Elevation of the affected area can also help to reduce pain and swelling.

## 2017-04-01 NOTE — Op Note (Signed)
   Date of Surgery: 04/01/2017  INDICATIONS: Ms. [name] is a 56 y.o.-year-old female with a right proximal humerus fracture;  The patient did consent to the procedure after discussion of the risks and benefits.  PREOPERATIVE DIAGNOSIS: Displaced right 4 part proximal humerus fracture  POSTOPERATIVE DIAGNOSIS: Same.  PROCEDURE:  1. Open reduction internal fixation of right 4 part proximal humerus fracture 2. Subpectoralis biceps tenodesis  SURGEON: N. Glee ArvinMichael Cathline Dowen, M.D.  ASSIST: April Chilton SiGreen, RNFA.  ANESTHESIA:  general  IV FLUIDS AND URINE: See anesthesia.  ESTIMATED BLOOD LOSS: 200 mL.  IMPLANTS: Biomet  DRAINS: None  COMPLICATIONS: None.  DESCRIPTION OF PROCEDURE: The patient was brought to the operating room and placed supine on the operating table.  The patient had been signed prior to the procedure and this was documented. The patient had the anesthesia placed by the anesthesiologist.  A time-out was performed to confirm that this was the correct patient, site, side and location. The patient did receive antibiotics prior to the incision and was re-dosed during the procedure as needed at indicated intervals.  The patient had the operative extremity prepped and draped in the standard surgical fashion.    A standard deltopectoral approach was utilized. The fracture was then exposed. Retractors were then placed for adequate exposure. The fracture was then identified and the organized hematoma was removed. Fracture was then reduced and placed into adequate alignment and confirmed under fluoroscopy. A lateral proximal humerus plate was then placed just lateral to the bicipital groove. Max braid sutures were used to control the tuberosities and to bring them into reduction. With the fracture reduced I placed 3 nonlocking locking screws through the humeral shaft using standard AO technique. I then placed locking pegs through the proximal portion of the plate into the humeral head. The  tuberosities were then tied down to the plate. She did have bone loss within the humeral heads segment which was bone grafted using demineralized bone matrix. Final x-rays were taken. I then performed a subpectoralis biceps tenodesis using Max braid suture. The wound was then thoroughly irrigated. Surgical wound was closed in layer fashion using 2-0 Vicryl and 3-0 nylon. Sterile dressings were applied. Arm was placed in a shoulder sling. Patient tolerated the procedure well and no immediate complications.  POSTOPERATIVE PLAN: Patient will be discharged home. She is to wear the sling at all times.  Mayra ReelN. Michael Meshell Abdulaziz, MD [facility] [phone] 2:28 PM

## 2017-04-01 NOTE — Transfer of Care (Signed)
Immediate Anesthesia Transfer of Care Note  Patient: [name]  Procedure(s) Performed: Procedure(s): OPEN REDUCTION INTERNAL FIXATION (ORIF) RIGHT PROXIMAL HUMERUS FRACTURE (Right)  Patient Location: PACU  Anesthesia Type:General  Level of Consciousness: awake, alert  and oriented  Airway & Oxygen Therapy: Patient Spontanous Breathing and Patient connected to nasal cannula oxygen  Post-op Assessment: Report given to RN and Post -op Vital signs reviewed and stable  Post vital signs: Reviewed and stable  Last Vitals:  Vitals:   04/01/17 1200 04/01/17 1205  BP: 115/72 113/81  Pulse: (!) 54 (!) 53  Resp: 13 11  Temp:      Last Pain:  Vitals:   04/01/17 1045  TempSrc: Oral  PainSc: 8       Patients Stated Pain Goal: 5 (04/01/17 1045)  Complications: No apparent anesthesia complications nerve block working well

## 2017-04-04 ENCOUNTER — Telehealth (INDEPENDENT_AMBULATORY_CARE_PROVIDER_SITE_OTHER): Payer: Self-pay | Admitting: Orthopaedic Surgery

## 2017-04-04 ENCOUNTER — Encounter (HOSPITAL_COMMUNITY): Payer: Self-pay | Admitting: Orthopaedic Surgery

## 2017-04-04 NOTE — Telephone Encounter (Signed)
Advised we do  Not have any waterproof bandages here advised him to go to the pharmacy to purchase. Should not get it wet and we will see her at her scheduled appt date

## 2017-04-04 NOTE — Telephone Encounter (Signed)
Patient's husband called asked if he can stop by to pick up a couple of the water proof bandages. The number to contact Raiford NobleRick is [phone]

## 2017-04-08 ENCOUNTER — Telehealth (INDEPENDENT_AMBULATORY_CARE_PROVIDER_SITE_OTHER): Payer: Self-pay | Admitting: Orthopaedic Surgery

## 2017-04-08 ENCOUNTER — Telehealth (INDEPENDENT_AMBULATORY_CARE_PROVIDER_SITE_OTHER): Payer: Self-pay

## 2017-04-08 NOTE — Telephone Encounter (Signed)
Patient called wanting to speak with someone about setting up pain management. CB # [phone]

## 2017-04-08 NOTE — Telephone Encounter (Signed)
Work comp case mgr is request a current work status on this patient

## 2017-04-09 NOTE — Telephone Encounter (Signed)
Out of work for 3 months

## 2017-04-11 ENCOUNTER — Encounter (INDEPENDENT_AMBULATORY_CARE_PROVIDER_SITE_OTHER): Payer: Self-pay

## 2017-04-11 ENCOUNTER — Telehealth (INDEPENDENT_AMBULATORY_CARE_PROVIDER_SITE_OTHER): Payer: Self-pay | Admitting: Radiology

## 2017-04-11 ENCOUNTER — Telehealth (INDEPENDENT_AMBULATORY_CARE_PROVIDER_SITE_OTHER): Payer: Self-pay

## 2017-04-11 MED ORDER — OXYCODONE-ACETAMINOPHEN 5-325 MG PO TABS: 1.0000 | ORAL_TABLET | Freq: Two times a day (BID) | ORAL | 0 refills | Status: DC | PRN

## 2017-04-11 NOTE — Telephone Encounter (Signed)
Too soon.  Shes only 1 week postop

## 2017-04-11 NOTE — Telephone Encounter (Signed)
Faxed work note, last office note, and op note to case mgr

## 2017-04-11 NOTE — Telephone Encounter (Signed)
Faxed op note, office note, and work note to case mgr per his request

## 2017-04-11 NOTE — Telephone Encounter (Signed)
Is this okay?

## 2017-04-11 NOTE — Telephone Encounter (Signed)
Patient's husband called and said that patient will run out of her pain medication tomorrow.  He is asking for a refill for her. Please call him, or you can call her at [phone], thanks

## 2017-04-11 NOTE — Telephone Encounter (Signed)
PER DR XU(GAVE VERBAL AUTH)  OKAY TO FILL PERCOCET 1-2 TABS BID PRN PAIN # [id number]

## 2017-04-11 NOTE — Addendum Note (Signed)
Addended by: Albertina ParrGARCIA, Voris Tigert on: 04/11/2017 04:09 PM   Modules accepted: Orders

## 2017-04-15 ENCOUNTER — Encounter (INDEPENDENT_AMBULATORY_CARE_PROVIDER_SITE_OTHER): Payer: Self-pay | Admitting: Orthopaedic Surgery

## 2017-04-15 ENCOUNTER — Ambulatory Visit (INDEPENDENT_AMBULATORY_CARE_PROVIDER_SITE_OTHER): Payer: Worker's Compensation | Admitting: Orthopaedic Surgery

## 2017-04-15 ENCOUNTER — Ambulatory Visit (INDEPENDENT_AMBULATORY_CARE_PROVIDER_SITE_OTHER): Payer: Self-pay

## 2017-04-15 DIAGNOSIS — S42291A Other displaced fracture of upper end of right humerus, initial encounter for closed fracture: Secondary | ICD-10-CM

## 2017-04-15 MED ORDER — TIZANIDINE HCL 4 MG PO TABS: 4.0000 mg | ORAL_TABLET | Freq: Three times a day (TID) | ORAL | 2 refills | Status: DC | PRN

## 2017-04-15 MED ORDER — OXYCODONE-ACETAMINOPHEN 5-325 MG PO TABS: 1.0000 | ORAL_TABLET | Freq: Three times a day (TID) | ORAL | 0 refills | Status: DC | PRN

## 2017-04-15 NOTE — Progress Notes (Signed)
Patient is 2 weeks status post ORIF right proximal humerus fracture. She complains of severe pain without pain medicines. She is also complaining of weakness of the arm. Denies any numbness and tingling. On exam her incision has healed. There is no signs of infection. She is neurovascular intact. Deltoid function is intact but weak. X-ray show stable fixation. Sutures were removed today. We will wait 2 more weeks before beginning range of motion given the comminuted nature of the fracture. Percocet refilled. Tizanidine prescription given. We'll need two-view x-rays of the right shoulder on return.

## 2017-04-18 ENCOUNTER — Telehealth (INDEPENDENT_AMBULATORY_CARE_PROVIDER_SITE_OTHER): Payer: Self-pay | Admitting: Orthopaedic Surgery

## 2017-04-18 NOTE — Telephone Encounter (Signed)
Patient husband Raiford Noble(Rick) called asked for a call back concerning his wife Rx for (oxycodone) He advised the pharmacy would not fill Rx because the insurance company need prior auth. The number to contact Raiford NobleRick is [phone] Raiford NobleRick or [phone] [place]

## 2017-04-19 ENCOUNTER — Telehealth (INDEPENDENT_AMBULATORY_CARE_PROVIDER_SITE_OTHER): Payer: Self-pay

## 2017-04-19 NOTE — Telephone Encounter (Signed)
Called patient to let her know

## 2017-04-19 NOTE — Telephone Encounter (Signed)
Rx and some workers comp info was faxed to case manager [phone] to see if they can get RX approved.

## 2017-04-19 NOTE — Telephone Encounter (Signed)
Faxed the 04/15/17 office note pt case mgr per his request

## 2017-04-20 ENCOUNTER — Telehealth (INDEPENDENT_AMBULATORY_CARE_PROVIDER_SITE_OTHER): Payer: Self-pay | Admitting: Orthopaedic Surgery

## 2017-04-20 NOTE — Telephone Encounter (Signed)
Patient called advised the [facility]:   Claim Number [id number]   [employer] company  Ph# [phone] or [phone]  Adam Phenixileen Grady  The number to contact patient is [phone]

## 2017-04-21 ENCOUNTER — Telehealth (INDEPENDENT_AMBULATORY_CARE_PROVIDER_SITE_OTHER): Payer: Self-pay

## 2017-04-21 NOTE — Telephone Encounter (Signed)
Tried to call several numbers they have given me and none of those have helped me get patients Rx Approved. Will call patient to advise. Do you know why this isn't being approved and is need in a PA?

## 2017-04-21 NOTE — Telephone Encounter (Signed)
Called [phone] and they gave me another Number to call [phone] opt 1 then opt 0

## 2017-04-26 NOTE — Telephone Encounter (Signed)
Faxed PA form

## 2017-04-29 ENCOUNTER — Ambulatory Visit (INDEPENDENT_AMBULATORY_CARE_PROVIDER_SITE_OTHER): Payer: Worker's Compensation | Admitting: Orthopaedic Surgery

## 2017-04-29 ENCOUNTER — Encounter (INDEPENDENT_AMBULATORY_CARE_PROVIDER_SITE_OTHER): Payer: Self-pay | Admitting: Orthopaedic Surgery

## 2017-04-29 ENCOUNTER — Ambulatory Visit (INDEPENDENT_AMBULATORY_CARE_PROVIDER_SITE_OTHER): Payer: Self-pay

## 2017-04-29 ENCOUNTER — Telehealth (INDEPENDENT_AMBULATORY_CARE_PROVIDER_SITE_OTHER): Payer: Self-pay | Admitting: Orthopaedic Surgery

## 2017-04-29 DIAGNOSIS — S42291A Other displaced fracture of upper end of right humerus, initial encounter for closed fracture: Secondary | ICD-10-CM | POA: Diagnosis not present

## 2017-04-29 MED ORDER — TIZANIDINE HCL 4 MG PO TABS: 4.0000 mg | ORAL_TABLET | Freq: Three times a day (TID) | ORAL | 3 refills | Status: DC | PRN

## 2017-04-29 NOTE — Telephone Encounter (Signed)
Patient called left voicemail message that she left her Rx for rehab on the desk when she checked out. The number to contact patient is [phone]

## 2017-04-29 NOTE — Progress Notes (Signed)
Patient is 4 weeks status post ORIF right 4 part proximal humerus fracture. She is overall improving. She is mainly taking muscle relaxers for the pain. Her surgical scars fully healed. She states she had a little drainage from the incision and we could go but this resolved on its own. She did not require any antibiotics. She denies any constitutional symptoms or signs or symptoms of infection. Her physical exam is benign. Healed surgical scar. Her deltoid is weak but is activating. X-ray show stable alignment with evidence of early healing. At this point begin gentle range of motion with physical therapy. Continue to be out of work as she is not medically ready. Follow-up in 4 weeks with repeat 2 view x-rays of the right shoulder.

## 2017-05-02 ENCOUNTER — Other Ambulatory Visit (INDEPENDENT_AMBULATORY_CARE_PROVIDER_SITE_OTHER): Payer: Self-pay | Admitting: Orthopaedic Surgery

## 2017-05-02 DIAGNOSIS — S42291A Other displaced fracture of upper end of right humerus, initial encounter for closed fracture: Secondary | ICD-10-CM

## 2017-05-02 NOTE — Telephone Encounter (Signed)
Another Order is made.   Sent message to Amy

## 2017-05-13 ENCOUNTER — Other Ambulatory Visit: Payer: Self-pay | Admitting: Cardiology

## 2017-05-16 ENCOUNTER — Other Ambulatory Visit: Payer: Self-pay | Admitting: Cardiology

## 2017-05-18 ENCOUNTER — Telehealth (INDEPENDENT_AMBULATORY_CARE_PROVIDER_SITE_OTHER): Payer: Self-pay

## 2017-05-18 NOTE — Telephone Encounter (Signed)
Patient would like a Rx refill for Oxycodone.  Cb#  [phone].  Please advise.  Thank You.

## 2017-05-18 NOTE — Telephone Encounter (Signed)
Please advise 

## 2017-05-19 MED ORDER — OXYCODONE-ACETAMINOPHEN 5-325 MG PO TABS: ORAL_TABLET | ORAL | 0 refills | Status: DC

## 2017-05-19 NOTE — Telephone Encounter (Signed)
#  10.  1 tab po daily prn

## 2017-05-19 NOTE — Telephone Encounter (Signed)
rx ready for pick up at the front desk

## 2017-05-19 NOTE — Progress Notes (Signed)
HPI The patient presents for evaluation of CAD.  [name] had her first heart attack at the age of 67 and it was treated with a stent to her left circumflex. In July 2006, [name] underwent her most recent catheterization for recurrent angina. This showed in-stent restenosis of the circumflex as well as 80% lesion in the LAD and first diagonal. [name] underwent Cypher drug-eluting stent for in-stent restenosis of the left circumflex as well as to the LAD. [name] had angioplasty of the second diagonal. ETT on 08/26/2015 showed no significant ST changes, [name] was only able to achieve 77% of the predicted heart rate as [name] took her beta blocker that morning.   [name] was seen recently for preoperative clearance for hip surgery.   [name] had a shoulder surgery for a very complicated fracture.  [name] has had a prolonged recovery and still has very limited range of motion.  [name] has had no cardiac complaints.  The patient denies any new symptoms such as chest discomfort, neck or arm discomfort. There has been no new shortness of breath, PND or orthopnea. There have been no reported palpitations, presyncope or syncope.    Allergies  Allergen Reactions  . Iodine Swelling and Rash    SWELLING REACTION UNSPECIFIED       Current Outpatient Prescriptions  Medication Sig Dispense Refill  . calcium-vitamin D (OSCAL WITH D) 500-200 MG-UNIT tablet Take 1 tablet by mouth 3 (three) times daily. 90 tablet 12  . clopidogrel (PLAVIX) 75 MG tablet Take 1 tablet (75 mg total) by mouth daily. 90 tablet 3  . clopidogrel (PLAVIX) 75 MG tablet TAKE 1 TABLET BY MOUTH EVERY DAY 30 tablet 1  . docusate sodium (COLACE) 100 MG capsule Take 1 capsule (100 mg total) by mouth every 12 (twelve) hours. 60 capsule 0  . folic acid (FOLVITE) 1 MG tablet TAKE 1 TABLET BY MOUTH EVERY DAY 30 tablet 1  . isosorbide mononitrate (IMDUR) 30 MG 24 hr tablet Take 1 tablet (30 mg total) by mouth daily. 90 tablet 3  . methocarbamol (ROBAXIN) 750 MG tablet Take 1  tablet (750 mg total) by mouth 2 (two) times daily as needed for muscle spasms. 60 tablet 0  . metoprolol tartrate (LOPRESSOR) 25 MG tablet Take 1 tablet (25 mg total) by mouth 2 (two) times daily. 180 tablet 3  . naproxen sodium (ANAPROX) 220 MG tablet Take 440 mg by mouth 2 (two) times daily with a meal.    . nitroGLYCERIN (NITROSTAT) 0.4 MG SL tablet Place 1 tablet (0.4 mg total) under the tongue every 5 (five) minutes as needed (chest pain). 25 tablet 3  . oxyCODONE (OXY IR/ROXICODONE) 5 MG immediate release tablet Take 1-2 tablets (5-10 mg total) by mouth every 6 (six) hours as needed. 30 tablet 0  . oxyCODONE (OXY IR/ROXICODONE) 5 MG immediate release tablet Take 1-3 tablets (5-15 mg total) by mouth every 4 (four) hours as needed. 90 tablet 0  . oxyCODONE (OXYCONTIN) 10 mg 12 hr tablet Take 1 tablet (10 mg total) by mouth every 12 (twelve) hours. 10 tablet 0  . oxyCODONE-acetaminophen (PERCOCET) 5-325 MG tablet 1 tab po daily prn 10 tablet 0  . oxyCODONE-acetaminophen (PERCOCET/ROXICET) 5-325 MG tablet Take 2 tablets by mouth every 4 (four) hours as needed. 30 tablet 0  . oxyCODONE-acetaminophen (PERCOCET/ROXICET) 5-325 MG tablet Take 1-2 tablets by mouth 2 (two) times daily as needed for severe pain. 30 tablet 0  . pantoprazole (PROTONIX) 40 MG tablet Take 1 tablet (40  mg total) by mouth daily. 90 tablet 3  . promethazine (PHENERGAN) 25 MG tablet Take 1 tablet (25 mg total) by mouth every 6 (six) hours as needed for nausea. 30 tablet 1  . ramipril (ALTACE) 2.5 MG capsule Take 1 capsule (2.5 mg total) by mouth daily. 90 capsule 3  . rosuvastatin (CRESTOR) 40 MG tablet Take 1 tablet (40 mg total) by mouth daily. 90 tablet 3  . senna-docusate (SENOKOT S) 8.6-50 MG tablet Take 1 tablet by mouth at bedtime as needed. 30 tablet 1  . tiZANidine (ZANAFLEX) 4 MG tablet Take 1 tablet (4 mg total) by mouth every 8 (eight) hours as needed for muscle spasms. 30 tablet 3  . zinc sulfate 220 (50 Zn) MG  capsule Take 1 capsule (220 mg total) by mouth daily. 42 capsule 0   No current facility-administered medications for this visit.     Past Medical History:  Diagnosis Date  . CAD (coronary artery disease)    cypher stent  . GERD (gastroesophageal reflux disease)   . Headache   . Heart attack (HCC)    x 2  . HLD (hyperlipidemia)   . HTN (hypertension)   . Humerus fracture   . Obesity   . Tinnitus     Past Surgical History:  Procedure Laterality Date  . APPENDECTOMY    . CARDIAC CATHETERIZATION    . coronary stents     x 4  . EYE SURGERY     lasik  . FRACTURE SURGERY     left ankle  . MULTIPLE TOOTH EXTRACTIONS    . ORIF HUMERUS FRACTURE Right 04/01/2017   Procedure: OPEN REDUCTION INTERNAL FIXATION (ORIF) RIGHT PROXIMAL HUMERUS FRACTURE;  Surgeon: Tarry KosXu, Naiping M, MD;  Location: [facility] OR;  Service: Orthopedics;  Laterality: Right;    ROS:  Positive for neuropathy.  Otherwise as stated in the HPI and negative for all other systems.  PHYSICAL EXAM BP 100/60   Pulse 68   Ht 5\' 3"  (1.6 m)   Wt 241 lb (109.3 kg)   BMI 42.69 kg/m  GENERAL:  Well appearing NECK:  No jugular venous distention, waveform within normal limits, carotid upstroke brisk and symmetric, no bruits, no thyromegaly LUNGS:  Clear to auscultation bilaterally BACK:  No CVA tenderness CHEST:  Unremarkable HEART:  PMI not displaced or sustained,S1 and S2 within normal limits, no S3, no S4, no clicks, no rubs, no murmurs ABD:  Flat, positive bowel sounds normal in frequency in pitch, no bruits, no rebound, no guarding, no midline pulsatile mass, no hepatomegaly, no splenomegaly EXT:  2 plus pulses throughout, no edema, no cyanosis no clubbing, mild edema  EKG:  NA  ASSESSMENT AND PLAN  CORONARY ARTERY DISEASE -  The patient has no new sypmtoms.  No further cardiovascular testing is indicated.  We will continue with aggressive risk reduction and meds as listed.  I will likely do another stress test in a year  or so.  [name] needs continued risk reduction.      HYPERLIPIDEMIA -  [name] needs a lipid profile.  Her goal LDL will be 70.  HTN - The blood pressure is at target. No change in medications is indicated. We will continue with therapeutic lifestyle changes (TLC).  TOBACCO USER -  [name] has tried everything to quit.  [name] does not really want to quit.    If I quit smoking I would be dead."    FOOT PAIN - This sounds like neuropathy and is either  from her back problems or peripheral .  I have suggested that [name] try benfotiamine.

## 2017-05-19 NOTE — Addendum Note (Signed)
Addended by: Albertina ParrGARCIA, Riggs Dineen on: 05/19/2017 09:50 AM   Modules accepted: Orders

## 2017-05-19 NOTE — Telephone Encounter (Signed)
Patient aware.

## 2017-05-20 ENCOUNTER — Encounter: Payer: Self-pay | Admitting: Cardiology

## 2017-05-20 ENCOUNTER — Ambulatory Visit: Payer: BLUE CROSS/BLUE SHIELD | Admitting: Cardiology

## 2017-05-20 ENCOUNTER — Ambulatory Visit (INDEPENDENT_AMBULATORY_CARE_PROVIDER_SITE_OTHER): Payer: BLUE CROSS/BLUE SHIELD | Admitting: Cardiology

## 2017-05-20 VITALS — BP 100/60 | HR 68 | Ht 63.0 in | Wt 241.0 lb

## 2017-05-20 DIAGNOSIS — Z79899 Other long term (current) drug therapy: Secondary | ICD-10-CM

## 2017-05-20 DIAGNOSIS — M79671 Pain in right foot: Secondary | ICD-10-CM

## 2017-05-20 DIAGNOSIS — Z720 Tobacco use: Secondary | ICD-10-CM

## 2017-05-20 DIAGNOSIS — M79672 Pain in left foot: Secondary | ICD-10-CM

## 2017-05-20 DIAGNOSIS — I2510 Atherosclerotic heart disease of native coronary artery without angina pectoris: Secondary | ICD-10-CM

## 2017-05-20 DIAGNOSIS — E785 Hyperlipidemia, unspecified: Secondary | ICD-10-CM | POA: Diagnosis not present

## 2017-05-20 NOTE — Patient Instructions (Signed)
Medication Instructions:  Continue  If you need a refill on your cardiac medications before your next appointment, please call your pharmacy.  Labwork: Fasting Lipid Liver   Testing/Procedures: None Ordered  Follow-Up: Your physician wants you to follow-up in: 1 Year. You should receive a reminder letter in the mail two months in advance. If you do not receive a letter, please call our office [phone].    Special Instructions:  Benfotiamine    Thank you for choosing [facility] at [facility]!!

## 2017-05-21 ENCOUNTER — Encounter: Payer: Self-pay | Admitting: Cardiology

## 2017-05-21 DIAGNOSIS — Z79899 Other long term (current) drug therapy: Secondary | ICD-10-CM | POA: Insufficient documentation

## 2017-05-30 ENCOUNTER — Ambulatory Visit (INDEPENDENT_AMBULATORY_CARE_PROVIDER_SITE_OTHER): Payer: Worker's Compensation | Admitting: Orthopaedic Surgery

## 2017-05-30 ENCOUNTER — Ambulatory Visit (INDEPENDENT_AMBULATORY_CARE_PROVIDER_SITE_OTHER): Payer: Worker's Compensation

## 2017-05-30 DIAGNOSIS — S42291A Other displaced fracture of upper end of right humerus, initial encounter for closed fracture: Secondary | ICD-10-CM

## 2017-05-30 MED ORDER — HYDROCODONE-ACETAMINOPHEN 5-325 MG PO TABS: 1.0000 | ORAL_TABLET | Freq: Every day | ORAL | 0 refills | Status: DC | PRN

## 2017-05-30 NOTE — Progress Notes (Signed)
Patient is a 9 days status post ORIF left proximal humerus fracture. She is doing better than before but she still having a lot of weakness with shoulder abduction. She's doing physical therapy twice a week. She takes Aleve and Tylenol.  Physical exam shows fully healed surgical scar. She has axillary nerve sensation.  She has significant weakness with deltoid function. Difficult to assess whether she has nerve palsy or lack of effort.  Prescription for Norco. Axillary nerve EMG ordered with Dr. Alvester MorinNewton. Follow-up after the studies.

## 2017-06-09 ENCOUNTER — Telehealth (INDEPENDENT_AMBULATORY_CARE_PROVIDER_SITE_OTHER): Payer: Self-pay

## 2017-06-09 NOTE — Telephone Encounter (Signed)
Work comp is requesting updated work status note from pts last visit on 05/30/17

## 2017-06-09 NOTE — Telephone Encounter (Signed)
Out of work for now

## 2017-06-10 ENCOUNTER — Encounter (INDEPENDENT_AMBULATORY_CARE_PROVIDER_SITE_OTHER): Payer: Self-pay

## 2017-06-10 NOTE — Telephone Encounter (Signed)
Faxed office note and work note to [phone]

## 2017-06-15 ENCOUNTER — Ambulatory Visit (INDEPENDENT_AMBULATORY_CARE_PROVIDER_SITE_OTHER): Payer: Worker's Compensation | Admitting: Physical Medicine and Rehabilitation

## 2017-06-15 ENCOUNTER — Encounter (INDEPENDENT_AMBULATORY_CARE_PROVIDER_SITE_OTHER): Payer: Self-pay | Admitting: Physical Medicine and Rehabilitation

## 2017-06-15 DIAGNOSIS — R531 Weakness: Secondary | ICD-10-CM

## 2017-06-15 DIAGNOSIS — R202 Paresthesia of skin: Secondary | ICD-10-CM

## 2017-06-15 DIAGNOSIS — S42201S Unspecified fracture of upper end of right humerus, sequela: Secondary | ICD-10-CM

## 2017-06-15 NOTE — Progress Notes (Deleted)
Has had difficulty lifting right arm since surgery. PT has helped some, but she still cannot raise arm. No numbness or tingling.

## 2017-06-17 NOTE — Procedures (Signed)
EMG & NCV Findings: All nerve conduction studies (as indicated in the following tables) were within normal limits.    All examined muscles (as indicated in the following table) showed no evidence of electrical instability.    Impression: Essentially NORMAL electrodiagnostic study of the right upper limb.    There is no significant electrodiagnostic evidence of nerve mononeuropathy (specifically axillary nerve is intact), brachial plexopathy or cervical radiculopathy.    As you know, purely sensory or demyelinating radiculopathies and chemical radiculitis may not be detected with this particular electrodiagnostic study.  Recommendations: 1.  Follow-up with referring physician. 2.  Continue current management of symptoms.    Nerve Conduction Studies Anti Sensory Summary Table   Stim Site NR Peak (ms) Norm Peak (ms) P-T Amp (V) Norm P-T Amp Site1 Site2 Delta-P (ms) Dist (cm) Vel (m/s) Norm Vel (m/s)  Right Median Acr Palm Anti Sensory (2nd Digit)  33.2C  Wrist    3.2 <3.6 34.6 >10 Wrist Palm 1.2 0.0    Palm    2.0 <2.0 16.0          Motor Summary Table   Stim Site NR Onset (ms) Norm Onset (ms) O-P Amp (mV) Norm O-P Amp Site1 Site2 Delta-0 (ms) Dist (cm) Vel (m/s) Norm Vel (m/s)  Left Axillary Motor (Deltoid)  32.8C  Clavicle    4.1 <5 0.3         Right Axillary Motor (Deltoid)  32.7C  Clavicle    3.8 <5 0.1         Right Median Motor (Abd Poll Brev)  33C  Wrist    3.1 <4.2 5.8 >5 Elbow Wrist 3.5 18.5 53 >50  Elbow    6.6  1.5          EMG   Side Muscle Nerve Root Ins Act Fibs Psw Amp Dur Poly Recrt Int Dennie Bible Comment  Right Abd Poll Brev Median C8-T1 Nml Nml Nml Nml Nml 0 Nml Nml   Right 1stDorInt Ulnar C8-T1 Nml Nml Nml Nml Nml 0 Nml Nml   Right PronatorTeres Median C6-7 Nml Nml Nml Nml Nml 0 Nml Nml   Right Biceps Musculocut C5-6 Nml Nml Nml Nml Nml 0 Nml Nml   Right Supraspinatus Suprascap C5 Nml Nml Nml Nml Nml 0 Nml Nml   Right Deltoid Axillary C5-6 Nml Nml Nml Nml  Nml 0 Nml Nml     Nerve Conduction Studies Anti Sensory Left/Right Comparison   Stim Site L Lat (ms) R Lat (ms) L-R Lat (ms) L Amp (V) R Amp (V) L-R Amp (%) Site1 Site2 L Vel (m/s) R Vel (m/s) L-R Vel (m/s)  Median Acr Palm Anti Sensory (2nd Digit)  33.2C  Wrist  3.2   34.6  Wrist Palm     Palm  2.0   16.0         Motor Left/Right Comparison   Stim Site L Lat (ms) R Lat (ms) L-R Lat (ms) L Amp (mV) R Amp (mV) L-R Amp (%) Site1 Site2 L Vel (m/s) R Vel (m/s) L-R Vel (m/s)  Axillary Motor (Deltoid)  32.8C  Clavicle 4.1 3.8 0.3 0.3 0.1 66.7       Median Motor (Abd Poll Brev)  33C  Wrist  3.1   5.8  Elbow Wrist  53   Elbow  6.6   1.5           Waveforms:

## 2017-06-17 NOTE — Progress Notes (Signed)
[name] - 56 y.o. female MRN [id number]  Date of birth: 1961/07/21  Office Visit Note: Visit Date: 06/15/2017 PCP: Amy Savers, MD Referred by: Amy Savers, MD  Subjective: Chief Complaint  Patient presents with  . Right Arm - Pain, Weakness   HPI: [name] is a 56 year old right-hand dominant female who is approximately one month status post ORIF of a closed four-part proximal humerus fracture. She is currently undergoing physical therapy but still has difficulty with abduction and movement of the right shoulder. She exhibits today decreased range of motion really in all planes. She denies any tingling or numbness. Overall she's doing much better from a pain standpoint. She denies any frank radicular symptoms. She has not had prior electrodiagnostic studies.    ROS Otherwise per HPI.  Assessment & Plan: Visit Diagnoses:  1. Weakness   2. Closed fracture of proximal end of right humerus, unspecified fracture morphology, sequela   3. Paresthesia of skin     Plan: No additional findings.  Impression: Essentially NORMAL electrodiagnostic study of the right upper limb.    There is no significant electrodiagnostic evidence of nerve mononeuropathy (specifically axillary nerve is intact), brachial plexopathy or cervical radiculopathy.    As you know, purely sensory or demyelinating radiculopathies and chemical radiculitis may not be detected with this particular electrodiagnostic study.  Recommendations: 1.  Follow-up with referring physician. 2.  Continue current management of symptoms.  Meds & Orders: No orders of the defined types were placed in this encounter.   Orders Placed This Encounter  Procedures  . NCV with EMG (electromyography)    Follow-up: Return for [name].   Procedures: No procedures performed  EMG & NCV Findings: All nerve conduction studies (as indicated in the following tables) were within normal limits.    All examined  muscles (as indicated in the following table) showed no evidence of electrical instability.    Impression: Essentially NORMAL electrodiagnostic study of the right upper limb.    There is no significant electrodiagnostic evidence of nerve mononeuropathy (specifically axillary nerve is intact), brachial plexopathy or cervical radiculopathy.    As you know, purely sensory or demyelinating radiculopathies and chemical radiculitis may not be detected with this particular electrodiagnostic study.  Recommendations: 1.  Follow-up with referring physician. 2.  Continue current management of symptoms.    Nerve Conduction Studies Anti Sensory Summary Table   Stim Site NR Peak (ms) Norm Peak (ms) P-T Amp (V) Norm P-T Amp Site1 Site2 Delta-P (ms) Dist (cm) Vel (m/s) Norm Vel (m/s)  Right Median Acr Palm Anti Sensory (2nd Digit)  33.2C  Wrist    3.2 <3.6 34.6 >10 Wrist Palm 1.2 0.0    Palm    2.0 <2.0 16.0          Motor Summary Table   Stim Site NR Onset (ms) Norm Onset (ms) O-P Amp (mV) Norm O-P Amp Site1 Site2 Delta-0 (ms) Dist (cm) Vel (m/s) Norm Vel (m/s)  Left Axillary Motor (Deltoid)  32.8C  Clavicle    4.1 <5 0.3         Right Axillary Motor (Deltoid)  32.7C  Clavicle    3.8 <5 0.1         Right Median Motor (Abd Poll Brev)  33C  Wrist    3.1 <4.2 5.8 >5 Elbow Wrist 3.5 18.5 53 >50  Elbow    6.6  1.5          EMG   Side  Muscle Nerve Root Ins Act Fibs Psw Amp Dur Poly Recrt Int Dennie Bible Comment  Right Abd Poll Brev Median C8-T1 Nml Nml Nml Nml Nml 0 Nml Nml   Right 1stDorInt Ulnar C8-T1 Nml Nml Nml Nml Nml 0 Nml Nml   Right PronatorTeres Median C6-7 Nml Nml Nml Nml Nml 0 Nml Nml   Right Biceps Musculocut C5-6 Nml Nml Nml Nml Nml 0 Nml Nml   Right Supraspinatus Suprascap C5 Nml Nml Nml Nml Nml 0 Nml Nml   Right Deltoid Axillary C5-6 Nml Nml Nml Nml Nml 0 Nml Nml     Nerve Conduction Studies Anti Sensory Left/Right Comparison   Stim Site L Lat (ms) R Lat (ms) L-R Lat (ms) L Amp  (V) R Amp (V) L-R Amp (%) Site1 Site2 L Vel (m/s) R Vel (m/s) L-R Vel (m/s)  Median Acr Palm Anti Sensory (2nd Digit)  33.2C  Wrist  3.2   34.6  Wrist Palm     Palm  2.0   16.0         Motor Left/Right Comparison   Stim Site L Lat (ms) R Lat (ms) L-R Lat (ms) L Amp (mV) R Amp (mV) L-R Amp (%) Site1 Site2 L Vel (m/s) R Vel (m/s) L-R Vel (m/s)  Axillary Motor (Deltoid)  32.8C  Clavicle 4.1 3.8 0.3 0.3 0.1 66.7       Median Motor (Abd Poll Brev)  33C  Wrist  3.1   5.8  Elbow Wrist  53   Elbow  6.6   1.5           Waveforms:           Clinical History: No specialty comments available.  She reports that she has been smoking Cigarettes.  She has been smoking about 2.00 packs per day. She has never used smokeless tobacco. No results for input(s): HGBA1C, LABURIC in the last 8760 hours.  Objective:  VS:  HT:    WT:   BMI:     BP:   HR: bpm  TEMP: ( )  RESP:  Physical Exam  Ortho Exam Imaging: No results found.  Past Medical/Family/Surgical/Social History: Medications & Allergies reviewed per EMR Patient Active Problem List   Diagnosis Date Noted  . Medication management 05/21/2017  . Closed 4-part fracture of proximal humerus, right, initial encounter 03/28/2017  . LEG PAIN 01/23/2010  . TINNITUS, CHRONIC, RIGHT 11/26/2009  . TOBACCO USER 11/18/2009  . OVERWEIGHT/OBESITY 11/10/2009  . HLD (hyperlipidemia) 02/12/2009  . HYPERTENSION 02/12/2009  . Coronary atherosclerosis of native coronary artery 02/12/2009  . GERD 02/12/2009  . SEBACEOUS CYST, INFECTED 02/12/2009  . OTHER SPECIFIED DISORDER OF SKIN 02/12/2009   Past Medical History:  Diagnosis Date  . CAD (coronary artery disease)    cypher stent  . GERD (gastroesophageal reflux disease)   . Headache   . Heart attack (HCC)    x 2  . HLD (hyperlipidemia)   . HTN (hypertension)   . Humerus fracture   . Obesity   . Tinnitus    Family History  Problem Relation Age of Onset  . Heart attack Father 57        died at age 66  . Heart attack Mother   . Aortic aneurysm Mother        died of complication after surgery   Past Surgical History:  Procedure Laterality Date  . APPENDECTOMY    . CARDIAC CATHETERIZATION    . coronary stents     x  4  . EYE SURGERY     lasik  . FRACTURE SURGERY     left ankle  . MULTIPLE TOOTH EXTRACTIONS    . ORIF HUMERUS FRACTURE Right 04/01/2017   Procedure: OPEN REDUCTION INTERNAL FIXATION (ORIF) RIGHT PROXIMAL HUMERUS FRACTURE;  Surgeon: Tarry Kos, MD;  Location: [facility] OR;  Service: Orthopedics;  Laterality: Right;   Social History   Occupational History  . mortgage Firefighter    Social History Main Topics  . Smoking status: Current Every Day Smoker    Packs/day: 2.00    Types: Cigarettes  . Smokeless tobacco: Never Used     Comment: 2 ppd since age 3  . Alcohol use Yes     Comment: occ  . Drug use: No  . Sexual activity: Not on file

## 2017-06-23 ENCOUNTER — Telehealth (INDEPENDENT_AMBULATORY_CARE_PROVIDER_SITE_OTHER): Payer: Self-pay

## 2017-06-23 NOTE — Telephone Encounter (Signed)
Emailed the 06/15/17 emg/ncv report to the case mgr per his request

## 2017-06-27 ENCOUNTER — Ambulatory Visit: Payer: BLUE CROSS/BLUE SHIELD | Admitting: Cardiology

## 2017-06-27 ENCOUNTER — Telehealth (INDEPENDENT_AMBULATORY_CARE_PROVIDER_SITE_OTHER): Payer: Self-pay | Admitting: Orthopaedic Surgery

## 2017-06-27 NOTE — Telephone Encounter (Signed)
[name] with [employer] called needing the latest office note faxed to her. Also she need the operative note faxed as well if patient had surgery. The ph# is [phone]   The fax# is [phone]

## 2017-06-28 ENCOUNTER — Encounter (INDEPENDENT_AMBULATORY_CARE_PROVIDER_SITE_OTHER): Payer: Self-pay | Admitting: Orthopaedic Surgery

## 2017-06-28 ENCOUNTER — Ambulatory Visit (INDEPENDENT_AMBULATORY_CARE_PROVIDER_SITE_OTHER): Payer: Worker's Compensation | Admitting: Orthopaedic Surgery

## 2017-06-28 ENCOUNTER — Ambulatory Visit (INDEPENDENT_AMBULATORY_CARE_PROVIDER_SITE_OTHER): Payer: Worker's Compensation

## 2017-06-28 DIAGNOSIS — S42291A Other displaced fracture of upper end of right humerus, initial encounter for closed fracture: Secondary | ICD-10-CM

## 2017-06-28 NOTE — Progress Notes (Signed)
Patient is almost 3 months status post ORIF right proximal humerus fracture. Nerve conduction studies were negative for axillary nerve injury. She continues to have difficulty with abduction of her arm. Her passive range of motion is approximately 30 more than the active range of motion. Her surgical scar is fully healed. She has significant difficulty with active elevation of her arm.  Rotator cuff function is significantly weak. Her x-ray show healed proximal humerus fracture with continued inferior translation relative to the glenoid. At this point I would like her to continue to remain out of work so that she can undergo 6 weeks of work conditioning. We discussed that her rotator cuff is likely torn from the original injury. I'll see her back in 6 weeks for recheck.

## 2017-06-28 NOTE — Telephone Encounter (Signed)
Faxed note to [phone].

## 2017-07-05 ENCOUNTER — Telehealth (INDEPENDENT_AMBULATORY_CARE_PROVIDER_SITE_OTHER): Payer: Self-pay

## 2017-07-05 ENCOUNTER — Encounter (INDEPENDENT_AMBULATORY_CARE_PROVIDER_SITE_OTHER): Payer: Self-pay

## 2017-07-05 NOTE — Telephone Encounter (Signed)
Emailed the 06/28/17 office and work note to the case mgr per his request

## 2017-07-25 ENCOUNTER — Other Ambulatory Visit: Payer: Self-pay | Admitting: Cardiology

## 2017-07-25 ENCOUNTER — Telehealth (INDEPENDENT_AMBULATORY_CARE_PROVIDER_SITE_OTHER): Payer: Self-pay | Admitting: Orthopaedic Surgery

## 2017-07-25 NOTE — Telephone Encounter (Signed)
Patient came in requesting a RX refill on her hydrocodone.  She will be out at the end of this week.  CB#[phone].  Thank you.

## 2017-07-26 MED ORDER — HYDROCODONE-ACETAMINOPHEN 5-325 MG PO TABS: 1.0000 | ORAL_TABLET | Freq: Every day | ORAL | 0 refills | Status: DC | PRN

## 2017-07-26 NOTE — Telephone Encounter (Signed)
Rx(s) sent to pharmacy electronically.  

## 2017-07-26 NOTE — Telephone Encounter (Signed)
Called pt ,pt aware.

## 2017-07-26 NOTE — Telephone Encounter (Signed)
Please advise 

## 2017-07-26 NOTE — Telephone Encounter (Signed)
Rx printed

## 2017-07-26 NOTE — Telephone Encounter (Signed)

## 2017-07-28 ENCOUNTER — Other Ambulatory Visit (INDEPENDENT_AMBULATORY_CARE_PROVIDER_SITE_OTHER): Payer: Self-pay | Admitting: Orthopaedic Surgery

## 2017-08-09 ENCOUNTER — Ambulatory Visit (INDEPENDENT_AMBULATORY_CARE_PROVIDER_SITE_OTHER): Payer: Worker's Compensation

## 2017-08-09 ENCOUNTER — Ambulatory Visit (INDEPENDENT_AMBULATORY_CARE_PROVIDER_SITE_OTHER): Payer: Worker's Compensation | Admitting: Orthopaedic Surgery

## 2017-08-09 DIAGNOSIS — S42291A Other displaced fracture of upper end of right humerus, initial encounter for closed fracture: Secondary | ICD-10-CM

## 2017-08-09 NOTE — Progress Notes (Signed)
Office Visit Note   Patient: [name]           Date of Birth: [birth year]           MRN: [id number] Visit Date: 08/09/2017              Requested by: Gordy SaversKwiatkowski, Peter F, MD [address] [place], [place] [id number] PCP: Gordy SaversKwiatkowski, Peter F, MD   Assessment & Plan: Visit Diagnoses:  1. Closed 4-part fracture of proximal humerus, right, initial encounter     Plan: Patient is 4 months status post ORIF of  severe 4 part proximal humerus fracture.  Her x-rays demonstrate that she is improving in terms of her muscular function and that her humeral head is no longer sagging inferiorly as much.  She still has a little bit of inferior translation but I think her deltoid is starting to function much better now.  I want her to continue with work conditioning.  We agreed to allow her to go back to work on 08/31/2017 doing 4 hours a day with no repetitive use of the right arm and less than 10 pounds of lifting to see how she does with this.  In the meantime she needs to continue with physical therapy for conditioning.  I will see her back in 4 weeks to see how she is doing.  Anticipate will need an FCE in the near future. Total face to face encounter time was greater than 25 minutes and over half of this time was spent in counseling and/or coordination of care.  Follow-Up Instructions: Return in about 4 weeks (around 09/06/2017).   Orders:  Orders Placed This Encounter  Procedures  . XR Shoulder Right   No orders of the defined types were placed in this encounter.     Procedures: No procedures performed   Clinical Data: No additional findings.   Subjective: No chief complaint on file.   Patient is 4 months status post ORIF right proximal humerus fracture.  She states that she has recently felt much better and she feels like she has really made progress in the last week.  Her range of motion and strength have also improved.  She is taking hydrocodone and Robaxin as  needed especially around physical therapy sessions.  She has been doing work conditioning and has several more weeks left.  Denies any numbness and tingling.    Review of Systems   Objective: Vital Signs: There were no vitals taken for this visit.  Physical Exam  Ortho Exam Right shoulder exam shows a fully healed surgical scar.  She does have much better shoulder abduction strength.  Range of motion is approximately 75 degrees of shoulder abduction and forward flexion. Specialty Comments:  No specialty comments available.  Imaging: No results found.   PMFS History: Patient Active Problem List   Diagnosis Date Noted  . Medication management 05/21/2017  . Closed 4-part fracture of proximal humerus, right, initial encounter 03/28/2017  . LEG PAIN 01/23/2010  . TINNITUS, CHRONIC, RIGHT 11/26/2009  . TOBACCO USER 11/18/2009  . OVERWEIGHT/OBESITY 11/10/2009  . HLD (hyperlipidemia) 02/12/2009  . HYPERTENSION 02/12/2009  . Coronary atherosclerosis of native coronary artery 02/12/2009  . GERD 02/12/2009  . SEBACEOUS CYST, INFECTED 02/12/2009  . OTHER SPECIFIED DISORDER OF SKIN 02/12/2009   Past Medical History:  Diagnosis Date  . CAD (coronary artery disease)    cypher stent  . GERD (gastroesophageal reflux disease)   . Headache   . Heart attack (HCC)  x 2  . HLD (hyperlipidemia)   . HTN (hypertension)   . Humerus fracture   . Obesity   . Tinnitus     Family History  Problem Relation Age of Onset  . Heart attack Father [id number]       died at age 55  . Heart attack Mother   . Aortic aneurysm Mother        died of complication after surgery    Past Surgical History:  Procedure Laterality Date  . APPENDECTOMY    . CARDIAC CATHETERIZATION    . coronary stents     x 4  . EYE SURGERY     lasik  . FRACTURE SURGERY     left ankle  . MULTIPLE TOOTH EXTRACTIONS    . ORIF HUMERUS FRACTURE Right 04/01/2017   Procedure: OPEN REDUCTION INTERNAL FIXATION (ORIF) RIGHT  PROXIMAL HUMERUS FRACTURE;  Surgeon: Tarry KosXu, Naiping M, MD;  Location: [facility] OR;  Service: Orthopedics;  Laterality: Right;   Social History   Occupational History  . Occupation: Development worker, communitymortgage loan officer  Tobacco Use  . Smoking status: Current Every Day Smoker    Packs/day: 2.00    Types: Cigarettes  . Smokeless tobacco: Never Used  . Tobacco comment: 2 ppd since age 56  Substance and Sexual Activity  . Alcohol use: Yes    Comment: occ  . Drug use: No  . Sexual activity: Not on file

## 2017-08-17 ENCOUNTER — Telehealth (INDEPENDENT_AMBULATORY_CARE_PROVIDER_SITE_OTHER): Payer: Self-pay | Admitting: Orthopaedic Surgery

## 2017-08-17 NOTE — Telephone Encounter (Signed)
[name], [name]  [birth year]   Med refill  Hydrocodone Acetamin 5-325 mg

## 2017-08-18 NOTE — Telephone Encounter (Signed)
1 tab po daily prn pain #30

## 2017-08-18 NOTE — Telephone Encounter (Signed)
Is this okay if so how many and instructions?

## 2017-08-19 ENCOUNTER — Other Ambulatory Visit (INDEPENDENT_AMBULATORY_CARE_PROVIDER_SITE_OTHER): Payer: Self-pay

## 2017-08-19 MED ORDER — HYDROCODONE-ACETAMINOPHEN 5-325 MG PO TABS: ORAL_TABLET | ORAL | 0 refills | Status: DC

## 2017-08-19 NOTE — Telephone Encounter (Signed)
Rx Pending signature

## 2017-08-19 NOTE — Telephone Encounter (Signed)
RX READY FOR PICK UP . CALLED PATIENT. SHE IS AWARE.

## 2017-09-06 ENCOUNTER — Ambulatory Visit (INDEPENDENT_AMBULATORY_CARE_PROVIDER_SITE_OTHER): Payer: Worker's Compensation

## 2017-09-06 ENCOUNTER — Ambulatory Visit (INDEPENDENT_AMBULATORY_CARE_PROVIDER_SITE_OTHER): Payer: Worker's Compensation | Admitting: Orthopaedic Surgery

## 2017-09-06 DIAGNOSIS — S42291A Other displaced fracture of upper end of right humerus, initial encounter for closed fracture: Secondary | ICD-10-CM

## 2017-09-06 MED ORDER — TIZANIDINE HCL 4 MG PO TABS: 4.0000 mg | ORAL_TABLET | Freq: Four times a day (QID) | ORAL | 11 refills | Status: DC | PRN

## 2017-09-06 NOTE — Progress Notes (Signed)
Office Visit Note   Patient: [name]           Date of Birth: 11-04-1960           MRN: [id number] Visit Date: 09/06/2017              Requested by: Gordy SaversKwiatkowski, Peter F, MD [address] [place], [place] [address] PCP: Gordy SaversKwiatkowski, Peter F, MD   Assessment & Plan: Visit Diagnoses:  1. Closed 4-part fracture of proximal humerus, right, initial encounter     Plan: Patient is now 5 months status post ORIF of a severely displaced 4 part proximal humerus fracture.  At this point I want her to undergo an FCE to determine her restrictions.  Continue part-time work for 4 hours a day.  Questions encouraged and answered.  Prescription for Zanaflex. Total face to face encounter time was greater than 25 minutes and over half of this time was spent in counseling and/or coordination of care.  Follow-Up Instructions: Return in about 4 weeks (around 10/04/2017).   Orders:  Orders Placed This Encounter  Procedures  . XR Shoulder Right   Meds ordered this encounter  Medications  . tiZANidine (ZANAFLEX) 4 MG tablet    Sig: Take 1 tablet (4 mg total) by mouth every 6 (six) hours as needed for muscle spasms.    Dispense:  30 tablet    Refill:  11      Procedures: No procedures performed   Clinical Data: No additional findings.   Subjective: Chief Complaint  Patient presents with  . Right Shoulder - Pain, Follow-up    Patient is 5 months status post ORIF right proximal humerus fracture.  She has finished physical therapy and work conditioning.  She has returned to work part-time.  Overall she is improving slowly.    Review of Systems   Objective: Vital Signs: There were no vitals taken for this visit.  Physical Exam  Ortho Exam Right shoulder exam shows a mild improvement in range of motion and strength. Specialty Comments:  No specialty comments available.  Imaging: Xr Shoulder Right  Result Date: 09/06/2017 Stable fixation and healed proximal  humerus fracture.  Persistent inferior translation of humeral head    PMFS History: Patient Active Problem List   Diagnosis Date Noted  . Medication management 05/21/2017  . Closed 4-part fracture of proximal humerus, right, initial encounter 03/28/2017  . LEG PAIN 01/23/2010  . TINNITUS, CHRONIC, RIGHT 11/26/2009  . TOBACCO USER 11/18/2009  . OVERWEIGHT/OBESITY 11/10/2009  . HLD (hyperlipidemia) 02/12/2009  . HYPERTENSION 02/12/2009  . Coronary atherosclerosis of native coronary artery 02/12/2009  . GERD 02/12/2009  . SEBACEOUS CYST, INFECTED 02/12/2009  . OTHER SPECIFIED DISORDER OF SKIN 02/12/2009   Past Medical History:  Diagnosis Date  . CAD (coronary artery disease)    cypher stent  . GERD (gastroesophageal reflux disease)   . Headache   . Heart attack (HCC)    x 2  . HLD (hyperlipidemia)   . HTN (hypertension)   . Humerus fracture   . Obesity   . Tinnitus     Family History  Problem Relation Age of Onset  . Heart attack Father 7730       died at age 55  . Heart attack Mother   . Aortic aneurysm Mother        died of complication after surgery    Past Surgical History:  Procedure Laterality Date  . APPENDECTOMY    . CARDIAC CATHETERIZATION    .  coronary stents     x 4  . EYE SURGERY     lasik  . FRACTURE SURGERY     left ankle  . MULTIPLE TOOTH EXTRACTIONS    . ORIF HUMERUS FRACTURE Right 04/01/2017   Procedure: OPEN REDUCTION INTERNAL FIXATION (ORIF) RIGHT PROXIMAL HUMERUS FRACTURE;  Surgeon: Tarry KosXu, Naiping M, MD;  Location: [facility] OR;  Service: Orthopedics;  Laterality: Right;   Social History   Occupational History  . Occupation: Development worker, communitymortgage loan officer  Tobacco Use  . Smoking status: Current Every Day Smoker    Packs/day: 2.00    Types: Cigarettes  . Smokeless tobacco: Never Used  . Tobacco comment: 2 ppd since age 56  Substance and Sexual Activity  . Alcohol use: Yes    Comment: occ  . Drug use: No  . Sexual activity: Not on file

## 2017-09-27 ENCOUNTER — Telehealth (INDEPENDENT_AMBULATORY_CARE_PROVIDER_SITE_OTHER): Payer: Self-pay

## 2017-09-27 ENCOUNTER — Other Ambulatory Visit (INDEPENDENT_AMBULATORY_CARE_PROVIDER_SITE_OTHER): Payer: Self-pay

## 2017-09-27 MED ORDER — HYDROCODONE-ACETAMINOPHEN 5-325 MG PO TABS: ORAL_TABLET | ORAL | 0 refills | Status: DC

## 2017-09-27 NOTE — Telephone Encounter (Signed)
Patient would like a Rx refill on Hydrocodone.  Cb# is [phone].  Please advise.  Thank you.

## 2017-09-27 NOTE — Telephone Encounter (Signed)
20

## 2017-09-27 NOTE — Telephone Encounter (Signed)
Please advise 

## 2017-09-29 ENCOUNTER — Telehealth (INDEPENDENT_AMBULATORY_CARE_PROVIDER_SITE_OTHER): Payer: Self-pay

## 2017-09-29 NOTE — Telephone Encounter (Signed)
Rx ready for pick up. 

## 2017-09-29 NOTE — Telephone Encounter (Signed)
Fax the 10/04/17 office note, work note, and any orders to case mgr when ready Fax#[phone]

## 2017-10-04 ENCOUNTER — Telehealth (INDEPENDENT_AMBULATORY_CARE_PROVIDER_SITE_OTHER): Payer: Self-pay

## 2017-10-04 ENCOUNTER — Encounter (INDEPENDENT_AMBULATORY_CARE_PROVIDER_SITE_OTHER): Payer: Self-pay | Admitting: Orthopaedic Surgery

## 2017-10-04 ENCOUNTER — Ambulatory Visit (INDEPENDENT_AMBULATORY_CARE_PROVIDER_SITE_OTHER): Payer: Worker's Compensation | Admitting: Orthopaedic Surgery

## 2017-10-04 DIAGNOSIS — S42291A Other displaced fracture of upper end of right humerus, initial encounter for closed fracture: Secondary | ICD-10-CM

## 2017-10-04 NOTE — Telephone Encounter (Signed)
Amy ClockKristina (case Production designer, theatre/television/filmmanager) email:  [email or web address]

## 2017-10-04 NOTE — Progress Notes (Signed)
[name] comes in for follow-up.  She is 6 months out from ORIF right proximal humerus fracture.  This is a workers comp injury.  She returned to work on 08/31/2017 working 4-hour days.  We gave her a prescription for an FCE several weeks ago and [employer] has yet to approve this.  She also states that they have stopped sending her checks.  She feels as though she has no choice but to return to work this coming Monday full duty.  We will are going to email her case manager with an updated prescription for an FCE.  The patient will follow up with us once this is completed.

## 2017-10-04 NOTE — Telephone Encounter (Signed)
Patient wanted me to email FCE Order to [facility] case Production designer, theatre/television/filmmanager. Emailed FCE ORDER, Patient aware.    Kristina  Fax [phone] Phone [phone]   Also Mardella LaymanLindsey PA called her and left her a voicemail.

## 2017-10-05 NOTE — Telephone Encounter (Signed)
Faxed note to Lurena JoinerRebecca fax#[phone] and to adj Foye ClockKristina fax#[phone]

## 2017-11-01 ENCOUNTER — Ambulatory Visit (INDEPENDENT_AMBULATORY_CARE_PROVIDER_SITE_OTHER): Payer: Worker's Compensation

## 2017-11-01 ENCOUNTER — Encounter (INDEPENDENT_AMBULATORY_CARE_PROVIDER_SITE_OTHER): Payer: Self-pay | Admitting: Orthopaedic Surgery

## 2017-11-01 ENCOUNTER — Ambulatory Visit (INDEPENDENT_AMBULATORY_CARE_PROVIDER_SITE_OTHER): Payer: Worker's Compensation | Admitting: Orthopaedic Surgery

## 2017-11-01 ENCOUNTER — Other Ambulatory Visit (INDEPENDENT_AMBULATORY_CARE_PROVIDER_SITE_OTHER): Payer: Self-pay | Admitting: Orthopaedic Surgery

## 2017-11-01 DIAGNOSIS — G8929 Other chronic pain: Secondary | ICD-10-CM | POA: Diagnosis not present

## 2017-11-01 DIAGNOSIS — M25512 Pain in left shoulder: Secondary | ICD-10-CM | POA: Diagnosis not present

## 2017-11-01 DIAGNOSIS — M25532 Pain in left wrist: Secondary | ICD-10-CM

## 2017-11-01 DIAGNOSIS — S42291A Other displaced fracture of upper end of right humerus, initial encounter for closed fracture: Secondary | ICD-10-CM

## 2017-11-01 MED ORDER — DICLOFENAC SODIUM 1 % TD GEL: 2.0000 g | Freq: Four times a day (QID) | TRANSDERMAL | 5 refills | Status: DC

## 2017-11-01 MED ORDER — LIDOCAINE HCL 1 % IJ SOLN
3.0000 mL | INTRAMUSCULAR | Status: AC | PRN
  Administered 2017-11-01: 3 mL

## 2017-11-01 MED ORDER — METHYLPREDNISOLONE ACETATE 40 MG/ML IJ SUSP
40.0000 mg | INTRAMUSCULAR | Status: AC | PRN
  Administered 2017-11-01: 40 mg via INTRA_ARTICULAR

## 2017-11-01 MED ORDER — DICLOFENAC SODIUM 75 MG PO TBEC: 75.0000 mg | DELAYED_RELEASE_TABLET | Freq: Two times a day (BID) | ORAL | 2 refills | Status: DC

## 2017-11-01 MED ORDER — HYDROCODONE-ACETAMINOPHEN 5-325 MG PO TABS: 1.0000 | ORAL_TABLET | Freq: Every day | ORAL | 0 refills | Status: DC | PRN

## 2017-11-01 MED ORDER — BUPIVACAINE HCL 0.5 % IJ SOLN
3.0000 mL | INTRAMUSCULAR | Status: AC | PRN
  Administered 2017-11-01: 3 mL via INTRA_ARTICULAR

## 2017-11-01 NOTE — Progress Notes (Signed)
Office Visit Note   Patient: [name]           Date of Birth: [birth year]           MRN: [id number] Visit Date: 11/01/2017              Requested by: Gordy SaversKwiatkowski, Peter F, MD [address] [place], [place] [address] PCP: Gordy SaversKwiatkowski, Peter F, MD   Assessment & Plan: Visit Diagnoses:  1. Chronic left shoulder pain   2. Pain in left wrist   3. Closed 4-part fracture of proximal humerus, right, initial encounter     Plan: For the new problem of left shoulder and wrist pain I did give her a subacromial injection today which gave her relief during the anesthetic phase.  I also gave her a prescription for topical and oral diclofenac as well as Norco to use on a limited basis.  I think she likely has a sprain of her left wrist.  She may have had a previous injury to the radial styloid which healed.  In terms of her workers comp injury of her right shoulder she is now at MMI with a partial impairment rating of 50% of her right arm.  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  At this point I am releasing her.  She can follow-up as needed.  Follow-Up Instructions: Return if symptoms worsen or fail to improve.   Orders:  Orders Placed This Encounter  Procedures  . XR Shoulder Left  . XR Wrist Complete Left   Meds ordered this encounter  Medications  . diclofenac sodium (VOLTAREN) 1 % GEL    Sig: Apply 2 g topically 4 (four) times daily.    Dispense:  1 Tube    Refill:  5  . diclofenac (VOLTAREN) 75 MG EC tablet    Sig: Take 1 tablet (75 mg total) by mouth 2 (two) times daily.    Dispense:  30 tablet    Refill:  2  . HYDROcodone-acetaminophen (NORCO) 5-325 MG tablet    Sig: Take 1-2 tablets by mouth daily as needed.    Dispense:  30 tablet    Refill:  0      Procedures: Large Joint Inj: L subacromial bursa on 11/01/2017 5:57  PM Indications: pain Details: 22 G needle  Arthrogram: No  Medications: 3 mL lidocaine 1 %; 3 mL bupivacaine 0.5 %; 40 mg methylPREDNISolone acetate 40 MG/ML Outcome: tolerated well, no immediate complications Patient was prepped and draped in the usual sterile fashion.       Clinical Data: No additional findings.   Subjective: Chief Complaint  Patient presents with  . Right Shoulder - Pain, Follow-up    Patient comes in today for follow-up of her right proximal humerus fracture.  She recently had an FCE.  She also has a new injury to her left upper extremity status post mechanical fall from tripping over her cat.  She is complaining of wrist pain and left shoulder pain that is worse with elevation of the arm.  Denies any numbness and tingling or neck pain.  She can continues to complain of right shoulder stiffness.  She is back to work at this point.    Review of Systems  Constitutional: Negative.   HENT: Negative.   Eyes: Negative.   Respiratory: Negative.   Cardiovascular: Negative.  Endocrine: Negative.   Musculoskeletal: Negative.   Neurological: Negative.   Hematological: Negative.   Psychiatric/Behavioral: Negative.   All other systems reviewed and are negative.    Objective: Vital Signs: There were no vitals taken for this visit.  Physical Exam  Constitutional: She is oriented to person, place, and time. She appears well-developed and well-nourished.  Pulmonary/Chest: Effort normal.  Neurological: She is alert and oriented to person, place, and time.  Skin: Skin is warm. Capillary refill takes less than 2 seconds.  Psychiatric: She has a normal mood and affect. Her behavior is normal. Judgment and thought content normal.  Nursing note and vitals reviewed.   Ortho Exam Right shoulder exam shows a fully healed surgical scar.  She has forward flexion to 85 degrees, external rotation to 15 degrees, internal rotation to side of her hip.  Left shoulder  exam shows no impingement signs.  Mild discomfort and pain with rotator cuff testing.  Positive cross adduction sign.  Left wrist exam shows mild discomfort with palpation throughout the wrist.  Negative Finkelstein's.  No triggering.  Negative grind test.  Specialty Comments:  No specialty comments available.  Imaging: Xr Wrist Complete Left  Result Date: 11/01/2017 Questionable healed radial styloid fracture  Xr Shoulder Left  Result Date: 11/01/2017 No acute or structural abnormalities.  Degenerative AC joint    PMFS History: Patient Active Problem List   Diagnosis Date Noted  . Medication management 05/21/2017  . Closed 4-part fracture of proximal humerus, right, initial encounter 03/28/2017  . LEG PAIN 01/23/2010  . TINNITUS, CHRONIC, RIGHT 11/26/2009  . TOBACCO USER 11/18/2009  . OVERWEIGHT/OBESITY 11/10/2009  . HLD (hyperlipidemia) 02/12/2009  . HYPERTENSION 02/12/2009  . Coronary atherosclerosis of native coronary artery 02/12/2009  . GERD 02/12/2009  . SEBACEOUS CYST, INFECTED 02/12/2009  . OTHER SPECIFIED DISORDER OF SKIN 02/12/2009   Past Medical History:  Diagnosis Date  . CAD (coronary artery disease)    cypher stent  . GERD (gastroesophageal reflux disease)   . Headache   . Heart attack (HCC)    x 2  . HLD (hyperlipidemia)   . HTN (hypertension)   . Humerus fracture   . Obesity   . Tinnitus     Family History  Problem Relation Age of Onset  . Heart attack Father 21       died at age 25  . Heart attack Mother   . Aortic aneurysm Mother        died of complication after surgery    Past Surgical History:  Procedure Laterality Date  . APPENDECTOMY    . CARDIAC CATHETERIZATION    . coronary stents     x 4  . EYE SURGERY     lasik  . FRACTURE SURGERY     left ankle  . MULTIPLE TOOTH EXTRACTIONS    . ORIF HUMERUS FRACTURE Right 04/01/2017   Procedure: OPEN REDUCTION INTERNAL FIXATION (ORIF) RIGHT PROXIMAL HUMERUS FRACTURE;  Surgeon: Tarry Kos, MD;  Location: [facility] OR;  Service: Orthopedics;  Laterality: Right;   Social History   Occupational History  . Occupation: Development worker, community  Tobacco Use  . Smoking status: Current Every Day Smoker    Packs/day: 2.00    Types: Cigarettes  . Smokeless tobacco: Never Used  . Tobacco comment: 2 ppd since age 30  Substance and Sexual Activity  . Alcohol use: Yes    Comment: occ  . Drug use: No  . Sexual activity: Not on file

## 2017-11-07 ENCOUNTER — Telehealth (INDEPENDENT_AMBULATORY_CARE_PROVIDER_SITE_OTHER): Payer: Self-pay | Admitting: Orthopaedic Surgery

## 2017-11-07 NOTE — Telephone Encounter (Signed)
Patient is calling to see if Dr. Roda ShuttersXu has done her rating yet and sent it to the workers comp. Please advise # [phone]

## 2017-11-07 NOTE — Telephone Encounter (Signed)
See message below °

## 2017-11-07 NOTE — Telephone Encounter (Signed)
Yes I have done it already.

## 2017-11-07 NOTE — Telephone Encounter (Signed)
Called patient advised her  Dr Roda ShuttersXu is out of the office and will be back Thursday.

## 2017-11-10 NOTE — Telephone Encounter (Signed)
Faxed last OV note with rating  to her [phone] and to her old case manager [phone]

## 2017-11-24 ENCOUNTER — Telehealth (INDEPENDENT_AMBULATORY_CARE_PROVIDER_SITE_OTHER): Payer: Self-pay | Admitting: Orthopaedic Surgery

## 2017-11-24 NOTE — Telephone Encounter (Signed)
It's fine to take up to twice a day as long as she doesn't have an increase in blood pressure or any kidney problems or heart disease

## 2017-11-24 NOTE — Telephone Encounter (Signed)
Patient states Rx ( Diclofenac) has helped tremendously and had not had to take any pain meds or aleve at all. She would like to know if this is something she can take long term? If so, she would like refills sent to pharm.  [facility] [address]   Would like for me to fax and email last note to CNA:  "Hi Marisue IvanLiz , could you please send CNA the evaluation and release to [email or web address] , her fax number is [phone] and her phone number is [phone]. Please have herogr to confirm receipt .  Thank you "

## 2017-11-24 NOTE — Telephone Encounter (Signed)
Patient called requesting to talk with you in regards to the last medication Dr. Roda ShuttersXu gave her.  CB#[phone].  Thank you.

## 2017-11-25 NOTE — Telephone Encounter (Signed)
Called Patient back to advise. She states she has Heart disease.  How often/ instructions should she take this Rx.  Does she call to get a RF each time she runs out?

## 2017-11-26 NOTE — Telephone Encounter (Signed)
Have her check with her PCP first before she takes it regularly

## 2017-11-28 NOTE — Telephone Encounter (Signed)
Called to advise.  

## 2017-12-26 ENCOUNTER — Other Ambulatory Visit: Payer: Self-pay | Admitting: Cardiology

## 2017-12-27 NOTE — Telephone Encounter (Signed)
REFILL 

## 2018-01-18 ENCOUNTER — Other Ambulatory Visit (INDEPENDENT_AMBULATORY_CARE_PROVIDER_SITE_OTHER): Payer: Self-pay | Admitting: Orthopaedic Surgery

## 2018-01-18 ENCOUNTER — Other Ambulatory Visit (INDEPENDENT_AMBULATORY_CARE_PROVIDER_SITE_OTHER): Payer: Self-pay

## 2018-01-18 MED ORDER — DICLOFENAC SODIUM 75 MG PO TBEC: 75.0000 mg | DELAYED_RELEASE_TABLET | Freq: Two times a day (BID) | ORAL | 6 refills | Status: DC

## 2018-01-18 NOTE — Telephone Encounter (Signed)
Please send in 60 tabs and 6 refills please

## 2018-01-25 ENCOUNTER — Telehealth: Payer: Self-pay | Admitting: Cardiology

## 2018-01-25 NOTE — Telephone Encounter (Signed)
New Message:      Pt states she has been having a bad cough/feels like air supply is getting cut off/teeth are hurting. Pt has an appt on 5/31.

## 2018-01-25 NOTE — Telephone Encounter (Signed)
Spoke with pt who states she has been experiencing episodes of left sided cp that radiate up to the left side of her throat x 2 weeks. She report she had 2 MI in the past but it doesn't mimic those symptoms. She states that its happened 12-15 times within the last 2 weeks and she has had to take nitro on 3 different episodes which provided relief.   Pt also reports that within these 2 week she has noticed her teeth has became really sore and states it may be unrelated to symptoms but just noticed the symptoms has started around the same time.She currently denies any symptoms. Routing to Dr. [place] Lions for recommendation.

## 2018-01-26 NOTE — Telephone Encounter (Signed)
Spoke with pt, there are no APP's in the office today. Patient scheduled to see dr hochrein tomorrow to follow up symptoms.

## 2018-01-26 NOTE — Telephone Encounter (Signed)
See if she can be worked into an APP schedule today.

## 2018-01-26 NOTE — Progress Notes (Signed)
HPI The patient presents for evaluation of CAD.  She had her first heart attack at the age of 57 and it was treated with a stent to her left circumflex. In July 2006, she underwent her most recent catheterization for recurrent angina. This showed in-stent restenosis of the circumflex as well as 80% lesion in the LAD and first diagonal. She underwent Cypher drug-eluting stent for in-stent restenosis of the left circumflex as well as to the LAD. She had angioplasty of the second diagonal. ETT on 08/26/2015 showed no significant ST changes, she was only able to achieve 77% of the predicted heart rate as she took her beta blocker that morning.  She called yesterday complaining of chest pain and was added to my schedule today.      She describes episodes of something coming up in her throat.  This has been happening for a couple of weeks.  It is an uncomfortable sensation that she cannot bring on.  It happens sporadically.  She has not had any a couple of days.  It might happen then for the next few days.  She will have 3-4 episodes in a row.  She describes a sensation of discomfort in her upper chest and up into her jaw.  She is not particularly active because of shoulder trauma and surgery that she had a slow recovery from this.  She did take nitroglycerin once because of this and she is not sure whether it helped the symptoms.  She has not had any associated nausea vomiting or diaphoresis.  She has not had any palpitations, presyncope or syncope.  She does not know that this is like her previous angina.  She did have a root canal surgery not long ago and wonders if this could be related.  She has had a nonproductive cough for about 3 months.  She has not had fevers or chills.  Allergies  Allergen Reactions  . Iodine Swelling and Rash    SWELLING REACTION UNSPECIFIED       Current Outpatient Medications  Medication Sig Dispense Refill  . calcium-vitamin D (OSCAL WITH D) 500-200 MG-UNIT tablet Take 1  tablet by mouth 3 (three) times daily. 90 tablet 12  . clopidogrel (PLAVIX) 75 MG tablet Take 1 tablet (75 mg total) by mouth daily. 90 tablet 3  . [facility] ZINC 50 MG tablet TAKE 1 TABLET BY MOUTH EVERY DAY 100 tablet 0  . diclofenac (VOLTAREN) 75 MG EC tablet Take 1 tablet (75 mg total) by mouth 2 (two) times daily. 60 tablet 6  . diclofenac sodium (VOLTAREN) 1 % GEL Apply 2 g topically 4 (four) times daily. 1 Tube 5  . docusate sodium (COLACE) 100 MG capsule Take 1 capsule (100 mg total) by mouth every 12 (twelve) hours. 60 capsule 0  . folic acid (FOLVITE) 1 MG tablet TAKE 1 TABLET BY MOUTH EVERY DAY 30 tablet 5  . isosorbide mononitrate (IMDUR) 30 MG 24 hr tablet Take 1 tablet (30 mg total) by mouth daily. 90 tablet 3  . methocarbamol (ROBAXIN) 750 MG tablet Take 1 tablet (750 mg total) by mouth 2 (two) times daily as needed for muscle spasms. 60 tablet 0  . metoprolol tartrate (LOPRESSOR) 25 MG tablet Take 1 tablet (25 mg total) by mouth 2 (two) times daily. 180 tablet 3  . nitroGLYCERIN (NITROSTAT) 0.4 MG SL tablet Place 1 tablet (0.4 mg total) under the tongue every 5 (five) minutes as needed (chest pain). 25 tablet 3  .  oxyCODONE (OXY IR/ROXICODONE) 5 MG immediate release tablet Take 1-3 tablets (5-15 mg total) by mouth every 4 (four) hours as needed. 90 tablet 0  . pantoprazole (PROTONIX) 40 MG tablet Take 1 tablet (40 mg total) by mouth daily. 90 tablet 3  . ramipril (ALTACE) 2.5 MG capsule Take 1 capsule (2.5 mg total) by mouth daily. 90 capsule 3  . rosuvastatin (CRESTOR) 40 MG tablet Take 1 tablet (40 mg total) by mouth daily. 90 tablet 3  . diphenhydrAMINE (BENADRYL) 50 MG tablet Take 1 tablet (50 mg total) by mouth at bedtime as needed for itching. 1 tablet 0  . predniSONE (DELTASONE) 50 MG tablet Take 1 tablet 13 hours before, then 7 hours before, then 1 hour before procedure 3 tablet 0   No current facility-administered medications for this visit.     Past Medical History:    Diagnosis Date  . CAD (coronary artery disease)    cypher stent  . GERD (gastroesophageal reflux disease)   . Headache   . Heart attack (HCC)    x 2  . HLD (hyperlipidemia)   . HTN (hypertension)   . Humerus fracture   . Obesity   . Tinnitus     Past Surgical History:  Procedure Laterality Date  . APPENDECTOMY    . CARDIAC CATHETERIZATION    . coronary stents     x 4  . EYE SURGERY     lasik  . FRACTURE SURGERY     left ankle  . MULTIPLE TOOTH EXTRACTIONS    . ORIF HUMERUS FRACTURE Right 04/01/2017   Procedure: OPEN REDUCTION INTERNAL FIXATION (ORIF) RIGHT PROXIMAL HUMERUS FRACTURE;  Surgeon: Tarry Kos, MD;  Location: [facility] OR;  Service: Orthopedics;  Laterality: Right;    ROS: Positive for chronic leg swelling and neuropathy.   Otherwise as stated in the HPI and negative for all other systems.  PHYSICAL EXAM BP 100/64   Pulse 71   Ht  (1.6 m)   Wt 245 lb (111.1 kg)   BMI 43.40 kg/m   GENERAL:  Well appearing NECK:  No jugular venous distention, waveform within normal limits, carotid upstroke brisk and symmetric, no bruits, no thyromegaly LUNGS:  Clear to auscultation bilaterally CHEST:  Unremarkable HEART:  PMI not displaced or sustained,S1 and S2 within normal limits, no S3, no S4, no clicks, no rubs, no murmurs ABD:  Flat, positive bowel sounds normal in frequency in pitch, no bruits, no rebound, no guarding, no midline pulsatile mass, no hepatomegaly, no splenomegaly EXT:  2 plus pulses throughout, mild edema, no cyanosis no clubbing   EKG: Sinus rhythm, rate 71, axis within normal limits,Intervals within normal limits, no acute ST-T wave changes.  ASSESSMENT AND PLAN  CHEST PAIN:   Her chest pain has some typical and atypical features.  He has significant coronary artery disease.  This could be tachypalpitations and I want her to get an Alive Cor.  There is also possibility that this is new onset angina and she needs further testing given her past  history.  I will schedule for coronary CTA.    CORONARY ARTERY DISEASE -  This will be evaluated as above.  She needs better risk reduction.   HYPERLIPIDEMIA -  She is overdue for a lipid profile and it does not look like she returned for this after the last visit.  I will order a non fasting lipid profile since she needs other labs today.   HTN - The blood pressure is  at target.  No change in therapy.  TOBACCO USER -  She has tried everything to quit.  She does not really want to quit.    I have talked to her about this.

## 2018-01-27 ENCOUNTER — Ambulatory Visit (INDEPENDENT_AMBULATORY_CARE_PROVIDER_SITE_OTHER): Payer: BLUE CROSS/BLUE SHIELD | Admitting: Cardiology

## 2018-01-27 ENCOUNTER — Encounter: Payer: Self-pay | Admitting: Cardiology

## 2018-01-27 VITALS — BP 100/64 | HR 71 | Ht 63.0 in | Wt 245.0 lb

## 2018-01-27 DIAGNOSIS — R079 Chest pain, unspecified: Secondary | ICD-10-CM | POA: Diagnosis not present

## 2018-01-27 DIAGNOSIS — E785 Hyperlipidemia, unspecified: Secondary | ICD-10-CM

## 2018-01-27 DIAGNOSIS — I25119 Atherosclerotic heart disease of native coronary artery with unspecified angina pectoris: Secondary | ICD-10-CM

## 2018-01-27 MED ORDER — PREDNISONE 50 MG PO TABS: ORAL_TABLET | ORAL | 0 refills | Status: DC

## 2018-01-27 MED ORDER — DIPHENHYDRAMINE HCL 50 MG PO TABS: 50.0000 mg | ORAL_TABLET | Freq: Every evening | ORAL | 0 refills | Status: DC | PRN

## 2018-01-27 NOTE — Patient Instructions (Addendum)
Medication Instructions:  Take an extra half of Metoprolol the night before and morning of procedure(37.5 mg)  If you need a refill on your cardiac medications before your next appointment, please call your pharmacy.  Labwork: Lipids and BMP HERE IN OUR OFFICE AT [facility]  Take the provided lab slips with you to the lab for your blood draw.   You will need to fast. DO NOT EAT OR DRINK PAST MIDNIGHT.   Testing/Procedures: Non-Cardiac CT Angiography (CTA), is a special type of CT scan that uses a computer to produce multi-dimensional views of major blood vessels throughout the body. In CT angiography, a contrast material is injected through an IV to help visualize the blood vessels  Special Instructions: Please arrive at the [facility] main entrance of [facility] at       AM (30-45 minutes prior to test start time)  [facility] [address] [place], [place] [address] [phone]  Proceed to the [facility] Radiology Department (First Floor).  Please follow these instructions carefully (unless otherwise directed):  Hold all erectile dysfunction medications at least 48 hours prior to test.  On the Night Before the Test: . Drink plenty of water. . Do not consume any caffeinated/decaffeinated beverages or chocolate 12 hours prior to your test. . Do not take any antihistamines 12 hours prior to your test. . If you take Metformin do not take 24 hours prior to test. . If the patient has contrast allergy: ? Patient will need a prescription for Prednisone and very clear instructions (as follows): 1. Prednisone 50 mg - take 13 hours prior to test 2. Take another Prednisone 50 mg 7 hours prior to test 3. Take another Prednisone 50 mg 1 hour prior to test 4. Take Benadryl 50 mg 1 hour prior to test . Patient must complete all four doses of above prophylactic medications. . Patient will need a ride after test due to Benadryl.  On the Day of the Test: . Drink  plenty of water. Do not drink any water within one hour of the test. . Do not eat any food 4 hours prior to the test. . You may take your regular medications prior to the test. . IF NOT ON A BETA BLOCKER - Take 50 mg of lopressor (metoprolol) one hour before the test. . HOLD Furosemide morning of the test.  After the Test: . Drink plenty of water. . After receiving IV contrast, you may experience a mild flushed feeling. This is normal. . On occasion, you may experience a mild rash up to 24 hours after the test. This is not dangerous. If this occurs, you can take Benadryl 25 mg and increase your fluid intake. . If you experience trouble breathing, this can be serious. If it is severe call 911 IMMEDIATELY. If it is mild, please call our office. . If you take any of these medications: Glipizide/Metformin, Avandament, Glucavance, please do not take 48 hours after completing test.   Follow-Up: Your physician wants you to follow-up in: 6 Months    Thank you for choosing [facility] at [facility]!!

## 2018-01-28 LAB — LIPID PANEL
CHOL/HDL RATIO: 4.3 ratio (ref 0.0–4.4)
Cholesterol, Total: 182 mg/dL (ref 100–199)
HDL: 42 mg/dL (ref >39)
LDL Calculated: 101 mg/dL — ABNORMAL HIGH (ref 0–99)
TRIGLYCERIDES: 197 mg/dL — AB (ref 0–149)
VLDL Cholesterol Cal: 39 mg/dL (ref 5–40)

## 2018-01-28 LAB — BASIC METABOLIC PANEL
BUN / CREAT RATIO: 18 (ref 9–23)
BUN: 17 mg/dL (ref 6–24)
CALCIUM: 9.6 mg/dL (ref 8.7–10.2)
CHLORIDE: 102 mmol/L (ref 96–106)
CO2: 24 mmol/L (ref 20–29)
CREATININE: 0.95 mg/dL (ref 0.57–1.00)
GFR calc Af Amer: 77 mL/min/{1.73_m2} (ref >59)
GFR, EST NON AFRICAN AMERICAN: 67 mL/min/{1.73_m2} (ref >59)
Glucose: 81 mg/dL (ref 65–99)
POTASSIUM: 4 mmol/L (ref 3.5–5.2)
Sodium: 143 mmol/L (ref 134–144)

## 2018-01-30 ENCOUNTER — Telehealth: Payer: Self-pay | Admitting: *Deleted

## 2018-01-30 DIAGNOSIS — E785 Hyperlipidemia, unspecified: Secondary | ICD-10-CM

## 2018-01-30 MED ORDER — EZETIMIBE 10 MG PO TABS: 10.0000 mg | ORAL_TABLET | Freq: Every day | ORAL | 3 refills | Status: DC

## 2018-01-30 NOTE — Telephone Encounter (Signed)
Zetia 10 mg send to pt pharmacy, blood work order and mail to pt.Marland Kitchen

## 2018-01-30 NOTE — Telephone Encounter (Signed)
-----   Message from Rollene Rotunda, MD sent at 01/29/2018  8:56 PM EDT ----- Her LDL is not at target.  Please call and add Zetia 10 mg po daily disp number 90 with 3 refills.  Call Ms. Bulthuis with the results and send results to Gordy Savers, MD.  Repeat Lipid profile in 3 months.

## 2018-02-08 ENCOUNTER — Telehealth: Payer: Self-pay | Admitting: Cardiology

## 2018-02-08 NOTE — Telephone Encounter (Signed)
New message   1. What dental office are you calling from? Dr Scotty Court [place]  2. What is your office phone number? [phone]  3. What is your fax number? [phone]  4. What type of procedure is the patient having performed? 2 extractions  5. What date is procedure scheduled or is the patient there now? TBD  6. What is your question (ex. Antibiotics prior to procedure, holding medication-we need to know how long dentist wants pt to hold med)? Plavix

## 2018-02-09 ENCOUNTER — Other Ambulatory Visit (INDEPENDENT_AMBULATORY_CARE_PROVIDER_SITE_OTHER): Payer: Self-pay | Admitting: Orthopaedic Surgery

## 2018-02-09 NOTE — Telephone Encounter (Signed)
She doesn't need it any more.

## 2018-02-09 NOTE — Telephone Encounter (Signed)
Dr. Antoine Poche, her last PCI was in 11/2005, please make recommendation regarding plavix during dental procedure.

## 2018-02-10 NOTE — Telephone Encounter (Signed)
Follow up   [name] at [facility] is calling to check on the dental clearance that was sent on 5/22. Please call

## 2018-02-13 NOTE — Telephone Encounter (Signed)
She can hold the Plavix as needed for the dental procedure and then restart when OK with the treating provider.

## 2018-02-14 NOTE — Telephone Encounter (Signed)
   Primary Cardiologist: Rollene Rotunda, MD  Chart reviewed as part of pre-operative protocol coverage. Given past medical history and time since last visit, based on ACC/AHA guidelines, [name] would be at acceptable risk for the planned procedure without further cardiovascular testing. Per Dr. Antoine Poche, She can hold the Plavix as needed for the dental procedure and then restart when OK with the treating provider.    I will route this recommendation to the requesting party via Epic fax function and remove from pre-op pool.  Please call with questions.  Robbie Lis, PA-C 02/14/2018, 1:53 PM

## 2018-02-17 ENCOUNTER — Ambulatory Visit: Payer: Self-pay | Admitting: Physician Assistant

## 2018-02-24 ENCOUNTER — Telehealth (INDEPENDENT_AMBULATORY_CARE_PROVIDER_SITE_OTHER): Payer: Self-pay | Admitting: Orthopaedic Surgery

## 2018-02-24 NOTE — Telephone Encounter (Signed)
Patient called and said that [facility] that [facility] Roda ShuttersXu filled out had her rated at 30% disability when originally it was 50% . She would like this revised and resent.   Patients # [id number]

## 2018-02-27 NOTE — Telephone Encounter (Signed)
See below

## 2018-02-27 NOTE — Telephone Encounter (Signed)
Can you help me with this?

## 2018-02-27 NOTE — Telephone Encounter (Signed)
Yes sorry, my mistake.  It's 50%

## 2018-02-28 NOTE — Telephone Encounter (Signed)
Faxed corrected 25r form to Charlies ConstableKaren Jean-Gilles @ [employer] Fax#[phone]. Advised pt.

## 2018-03-03 ENCOUNTER — Ambulatory Visit (HOSPITAL_COMMUNITY): Admission: RE | Admit: 2018-03-03 | Payer: BLUE CROSS/BLUE SHIELD | Source: Ambulatory Visit

## 2018-03-03 ENCOUNTER — Encounter (HOSPITAL_COMMUNITY): Payer: Self-pay

## 2018-03-03 ENCOUNTER — Ambulatory Visit (HOSPITAL_COMMUNITY)
Admission: RE | Admit: 2018-03-03 | Discharge: 2018-03-03 | Disposition: A | Discharge status: Home / Self Care | Payer: BLUE CROSS/BLUE SHIELD | Source: Ambulatory Visit | Attending: Cardiology | Admitting: Cardiology

## 2018-03-03 DIAGNOSIS — R079 Chest pain, unspecified: Secondary | ICD-10-CM

## 2018-03-03 NOTE — Progress Notes (Signed)
Upon review of patient chart, staff noticed iodine allergy. CT tech aware and came over to talk to patient with RN. Patient stated last time she had some sort of iodine she had anaphylactic reaction with throat swelling. Patient stated she took 25 mg benadryl this am but RN and CT worry about safety of test without doing anaphlaxis protocol including steroids and benadryl. CT to coordinate with patients ordering MD to place patient on this protocol. Patient test to be rescheduled.

## 2018-03-13 ENCOUNTER — Telehealth: Payer: Self-pay | Admitting: *Deleted

## 2018-03-13 NOTE — Telephone Encounter (Signed)
Leave message for pt to call back 

## 2018-03-14 NOTE — Telephone Encounter (Signed)
Spoke with pt about rescheduling her CTA, advised pt I will speak with scheduling staff because pt want this done on a Friday afternoon and give her a call back.

## 2018-03-27 ENCOUNTER — Telehealth: Payer: Self-pay | Admitting: *Deleted

## 2018-03-27 DIAGNOSIS — Z79899 Other long term (current) drug therapy: Secondary | ICD-10-CM

## 2018-03-27 MED ORDER — PREDNISONE 50 MG PO TABS
ORAL_TABLET | ORAL | 0 refills | Status: DC
Start: 2018-03-27 — End: 2018-04-25

## 2018-03-27 MED ORDER — DIPHENHYDRAMINE HCL 50 MG PO TABS: 50.0000 mg | ORAL_TABLET | Freq: Every evening | ORAL | 0 refills | Status: DC | PRN

## 2018-03-27 NOTE — Telephone Encounter (Signed)
-----   Message from Pricilla HolmSharon B Ferguson sent at 03/16/2018  2:28 PM EDT ----- Regarding: ct  schedule for  03-31-18   Morrie SheldonAshley redo on precert/ expired    [name] - pt will need 13 hr prep.

## 2018-03-27 NOTE — Telephone Encounter (Signed)
Pt will be coming to office on Wednesday for blood work and instructions  Special Instructions: Please arrive at the [facility] main entrance of [facility] at       AM (30-45 minutes prior to test start time)  [facility] [address] [place], [place] [address] [phone]  Proceed to the [facility] Radiology Department (First Floor).  Please follow these instructions carefully (unless otherwise directed):  Hold all erectile dysfunction medications at least 48 hours prior to test.  On the Night Before the Test:  Drink plenty of water.  Do not consume any caffeinated/decaffeinated beverages or chocolate 12 hours prior to your test.  Do not take any antihistamines 12 hours prior to your test.  If you take Metformin do not take 24 hours prior to test.  If the patient has contrast allergy: ? Patient will need a prescription for Prednisone and very clear instructions (as follows): 1. Prednisone 50 mg - take 13 hours prior to test 2. Take another Prednisone 50 mg 7 hours prior to test 3. Take another Prednisone 50 mg 1 hour prior to test 4. Take Benadryl 50 mg 1 hour prior to test  Patient must complete all four doses of above prophylactic medications.  Patient will need a ride after test due to Benadryl.  On the Day of the Test:  Drink plenty of water. Do not drink any water within one hour of the test.  Do not eat any food 4 hours prior to the test.  You may take your regular medications prior to the test.  IF NOT ON A BETA BLOCKER - Take 50 mg of lopressor (metoprolol) one hour before the test.  HOLD Furosemide morning of the test.  After the Test:  Drink plenty of water.  After receiving IV contrast, you may experience a mild flushed feeling. This is normal.  On occasion, you may experience a mild rash up to 24 hours after the test. This is not dangerous. If this occurs, you can take Benadryl 25 mg and increase your fluid  intake.  If you experience trouble breathing, this can be serious. If it is severe call 911 IMMEDIATELY. If it is mild, please call our office.  If you take any of these medications: Glipizide/Metformin, Avandament, Glucavance, please do not take 48 hours after completing test.

## 2018-03-30 LAB — BASIC METABOLIC PANEL
BUN/Creatinine Ratio: 11 (ref 9–23)
BUN: 10 mg/dL (ref 6–24)
CHLORIDE: 104 mmol/L (ref 96–106)
CO2: 24 mmol/L (ref 20–29)
Calcium: 9.4 mg/dL (ref 8.7–10.2)
Creatinine, Ser: 0.92 mg/dL (ref 0.57–1.00)
GFR calc non Af Amer: 70 mL/min/{1.73_m2} (ref >59)
GFR, EST AFRICAN AMERICAN: 80 mL/min/{1.73_m2} (ref >59)
GLUCOSE: 112 mg/dL — AB (ref 65–99)
POTASSIUM: 4.2 mmol/L (ref 3.5–5.2)
SODIUM: 143 mmol/L (ref 134–144)

## 2018-03-31 ENCOUNTER — Ambulatory Visit (HOSPITAL_COMMUNITY)
Admission: RE | Admit: 2018-03-31 | Discharge: 2018-03-31 | Disposition: A | Discharge status: Home / Self Care | Payer: BLUE CROSS/BLUE SHIELD | Source: Ambulatory Visit | Attending: Cardiology | Admitting: Cardiology

## 2018-03-31 ENCOUNTER — Ambulatory Visit (HOSPITAL_COMMUNITY): Payer: BLUE CROSS/BLUE SHIELD

## 2018-03-31 DIAGNOSIS — R079 Chest pain, unspecified: Secondary | ICD-10-CM | POA: Insufficient documentation

## 2018-03-31 DIAGNOSIS — R0789 Other chest pain: Secondary | ICD-10-CM | POA: Diagnosis not present

## 2018-03-31 DIAGNOSIS — I700 Atherosclerosis of aorta: Secondary | ICD-10-CM | POA: Diagnosis not present

## 2018-03-31 DIAGNOSIS — K760 Fatty (change of) liver, not elsewhere classified: Secondary | ICD-10-CM | POA: Diagnosis not present

## 2018-03-31 MED ORDER — NITROGLYCERIN 0.4 MG SL SUBL
0.8000 mg | SUBLINGUAL_TABLET | Freq: Once | SUBLINGUAL | Status: AC
  Administered 2018-03-31: 0.8 mg via SUBLINGUAL
  Filled 2018-03-31: qty 25

## 2018-03-31 MED ORDER — IOPAMIDOL (ISOVUE-370) INJECTION 76%
100.0000 mL | Freq: Once | INTRAVENOUS | Status: AC | PRN
  Administered 2018-03-31: 100 mL via INTRAVENOUS

## 2018-03-31 MED ORDER — NITROGLYCERIN 0.4 MG SL SUBL
SUBLINGUAL_TABLET | SUBLINGUAL | Status: AC
  Filled 2018-03-31: qty 2

## 2018-04-04 ENCOUNTER — Telehealth: Payer: Self-pay | Admitting: Cardiology

## 2018-04-04 NOTE — Telephone Encounter (Signed)
Spoke with Nya and Dr Antoine PocheHochrein did call patient, will forward

## 2018-04-04 NOTE — Telephone Encounter (Signed)
Called the patient to suggest cardiac cath.  She does not want to have this done at this point.  She has not had pain for a few days and she thinks that it is her teeth.  It goes away with mouth care and is not like previous angina.  I told her that the CT was not going to answer the question.  She prefers to hold off on cath but agrees to increase the Imdur to 60 mg.  We will need to call in a prescription.  She agrees to follow up in two weeks with a APP to discuss on a day that I am in the office or with me.

## 2018-04-04 NOTE — Telephone Encounter (Signed)
New Message     Patient returned Dr. Saul FordyceHochrien's call.

## 2018-04-05 MED ORDER — ISOSORBIDE MONONITRATE ER 60 MG PO TB24: 60.0000 mg | ORAL_TABLET | Freq: Every day | ORAL | 3 refills | Status: DC

## 2018-04-05 NOTE — Telephone Encounter (Signed)
Imdur 60 mg send into pt [facility] and appt made for pt with Azalee CourseHao Meng on 08/06 same day Dr Antoine PocheHochrein in office

## 2018-04-25 ENCOUNTER — Other Ambulatory Visit: Payer: Self-pay | Admitting: *Deleted

## 2018-04-25 ENCOUNTER — Encounter: Payer: Self-pay | Admitting: Cardiology

## 2018-04-25 ENCOUNTER — Ambulatory Visit: Payer: BLUE CROSS/BLUE SHIELD | Admitting: Physician Assistant

## 2018-04-25 ENCOUNTER — Ambulatory Visit (INDEPENDENT_AMBULATORY_CARE_PROVIDER_SITE_OTHER): Payer: BLUE CROSS/BLUE SHIELD | Admitting: Cardiology

## 2018-04-25 ENCOUNTER — Other Ambulatory Visit: Payer: Self-pay | Admitting: Physician Assistant

## 2018-04-25 VITALS — BP 87/60 | HR 68 | Ht 63.0 in | Wt 252.8 lb

## 2018-04-25 DIAGNOSIS — R1900 Intra-abdominal and pelvic swelling, mass and lump, unspecified site: Secondary | ICD-10-CM | POA: Diagnosis not present

## 2018-04-25 DIAGNOSIS — E785 Hyperlipidemia, unspecified: Secondary | ICD-10-CM | POA: Diagnosis not present

## 2018-04-25 DIAGNOSIS — I2510 Atherosclerotic heart disease of native coronary artery without angina pectoris: Secondary | ICD-10-CM | POA: Diagnosis not present

## 2018-04-25 NOTE — Progress Notes (Signed)
HPI The patient presents for evaluation of CAD.  She had her first heart attack at the age of 57 and it was treated with a stent to her left circumflex. In July 2006, she underwent her most recent catheterization for recurrent angina. This showed in-stent restenosis of the circumflex as well as 80% lesion in the LAD and first diagonal. She underwent Cypher drug-eluting stent for in-stent restenosis of the left circumflex as well as to the LAD. She had angioplasty of the second diagonal. ETT on 08/26/2015 showed no significant ST changes, she was only able to achieve 77% of the predicted heart rate as she took her beta blocker that morning.  I saw her in May and she had some chest pain.  A coronary CTA.  However, because of previous stenting and her body habitus this  was not adequate to exclude obstructive coronary disease.  I called her up to discuss cardiac catheterization.  However, she really did not want to have this as her symptoms improved and she thought it was related to a dental problem as it would get better with Listerine.  The discomfort was up in her jaw and mouth and unlike previous angina.  What happens sporadically.  She actually has have not in the last 2 to 3 weeks.  She has not been active but she does work and does her chores of daily living.  She is not able to bring on the symptoms with this.  She has no new shortness of breath, PND or orthopnea.  She is not having any palpitations, presyncope or syncope.   Allergies  Allergen Reactions  . Iodine Swelling and Rash    SWELLING REACTION UNSPECIFIED       Current Outpatient Medications  Medication Sig Dispense Refill  . calcium-vitamin D (OSCAL WITH D) 500-200 MG-UNIT tablet Take 1 tablet by mouth 3 (three) times daily. 90 tablet 12  . clopidogrel (PLAVIX) 75 MG tablet Take 1 tablet (75 mg total) by mouth daily. 90 tablet 3  . [facility] ZINC 50 MG tablet TAKE 1 TABLET BY MOUTH EVERY DAY 100 tablet 0  . diclofenac (VOLTAREN) 75 MG  EC tablet Take 1 tablet (75 mg total) by mouth 2 (two) times daily. 60 tablet 6  . diclofenac sodium (VOLTAREN) 1 % GEL Apply 2 g topically 4 (four) times daily. 1 Tube 5  . diphenhydrAMINE (BENADRYL) 50 MG tablet Take 1 tablet (50 mg total) by mouth at bedtime as needed for itching. 1 tablet 0  . docusate sodium (COLACE) 100 MG capsule Take 1 capsule (100 mg total) by mouth every 12 (twelve) hours. 60 capsule 0  . ezetimibe (ZETIA) 10 MG tablet Take 1 tablet (10 mg total) by mouth daily. 90 tablet 3  . folic acid (FOLVITE) 1 MG tablet TAKE 1 TABLET BY MOUTH EVERY DAY 30 tablet 5  . isosorbide mononitrate (IMDUR) 60 MG 24 hr tablet Take 1 tablet (60 mg total) by mouth daily. 90 tablet 3  . methocarbamol (ROBAXIN) 750 MG tablet Take 1 tablet (750 mg total) by mouth 2 (two) times daily as needed for muscle spasms. 60 tablet 0  . metoprolol tartrate (LOPRESSOR) 25 MG tablet Take 1 tablet (25 mg total) by mouth 2 (two) times daily. 180 tablet 3  . nitroGLYCERIN (NITROSTAT) 0.4 MG SL tablet Place 1 tablet (0.4 mg total) under the tongue every 5 (five) minutes as needed (chest pain). 25 tablet 3  . pantoprazole (PROTONIX) 40 MG tablet TAKE 1 TABLET  BY MOUTH EVERY DAY 90 tablet 3  . ramipril (ALTACE) 2.5 MG capsule Take 1 capsule (2.5 mg total) by mouth daily. 90 capsule 3  . rosuvastatin (CRESTOR) 40 MG tablet Take 1 tablet (40 mg total) by mouth daily. 90 tablet 3   No current facility-administered medications for this visit.     Past Medical History:  Diagnosis Date  . CAD (coronary artery disease)    cypher stent  . GERD (gastroesophageal reflux disease)   . Headache   . Heart attack (HCC)    x 2  . HLD (hyperlipidemia)   . HTN (hypertension)   . Humerus fracture   . Obesity   . Tinnitus     Past Surgical History:  Procedure Laterality Date  . APPENDECTOMY    . CARDIAC CATHETERIZATION    . coronary stents     x 4  . EYE SURGERY     lasik  . FRACTURE SURGERY     left ankle  .  MULTIPLE TOOTH EXTRACTIONS    . ORIF HUMERUS FRACTURE Right 04/01/2017   Procedure: OPEN REDUCTION INTERNAL FIXATION (ORIF) RIGHT PROXIMAL HUMERUS FRACTURE;  Surgeon: Tarry Kos, MD;  Location: [facility] OR;  Service: Orthopedics;  Laterality: Right;    ROS: Positive for neuropathic foot pain.   Otherwise as stated in the HPI and negative for all other systems.  PHYSICAL EXAM BP (!) 87/60   Pulse 68   Ht 5\' 3"  (1.6 m)   Wt 252 lb 12.8 oz (114.7 kg)   SpO2 96%   BMI 44.78 kg/m   GENERAL:  Well appearing NECK:  No jugular venous distention, waveform within normal limits, carotid upstroke brisk and symmetric, no bruits, no thyromegaly LUNGS:  Clear to auscultation bilaterally CHEST:  Unremarkable HEART:  PMI not displaced or sustained,S1 and S2 within normal limits, no S3, no S4, no clicks, no rubs, no murmurs ABD:  Flat, positive bowel sounds normal in frequency in pitch, no bruits, no rebound, no guarding, possible midline pulsatile mass, no hepatomegaly, no splenomegaly EXT:  2 plus pulses throughout, no edema, no cyanosis no clubbing   EKG: NA  ASSESSMENT AND PLAN  CHEST PAIN:     We had a long discussion about this.  Her chest pain is actually gone for to 3 weeks.  It was atypical to begin with compared to her previous angina.  She would prefer to defer catheterization and since her symptoms are improved at this point there is not a clear indication.  However, if she has any recurrent chest discomfort she agrees to call me at which point we would pursue cardiac catheterization.  In the meantime she needs aggressive secondary risk reduction.  CORONARY ARTERY DISEASE -  As above.    HYPERLIPIDEMIA -  I will refer her to the Lipid Clinic to consider PCSK9 since she is on max therapy and is not at target.    HTN - BP runs low.  No med titration.    TOBACCO USER -  She was counseled today.  PULSATILE MASS - Questionable abdominal aortic aneurysm.  I will order an abdominal  ultrasound.  NEUROPATHY - She seems to describe neuropathic pain.

## 2018-04-25 NOTE — Patient Instructions (Addendum)
Medication Instructions:  START BENFOTIAMINE OVER THE COUNTER   Labwork: NONE  Testing/Procedures: Your physician has requested that you have an abdominal aorta duplex. During this test, an ultrasound is used to evaluate the aorta. Allow 30 minutes for this exam. Do not eat after midnight the day before and avoid carbonated beverages  Follow-Up: Your physician recommends that you schedule a follow-up appointment in: PHARM D FOR LIPIDS  Your physician recommends that you schedule a follow-up appointment in: 4 MONTHS   Any Other Special Instructions Will Be Listed Below (If Applicable).  CALL IF YOU HAVE ANY FURTHER PROBLEMS    If you need a refill on your cardiac medications before your next appointment, please call your pharmacy.

## 2018-04-30 ENCOUNTER — Other Ambulatory Visit: Payer: Self-pay | Admitting: Physician Assistant

## 2018-04-30 IMAGING — RF DG SHOULDER 2+V*R*
1 series · 2 of 2 positions shown · non-contrast
Comparison: CT scan and radiographs dated 03/24/2017

CLINICAL DATA: Proximal right humerus fracture.

EXAM:
RIGHT SHOULDER - 2+ VIEW; DG C-ARM 61-120 MIN

[Series 1: run · 2 of 2 slices shown]
[im 1/2]
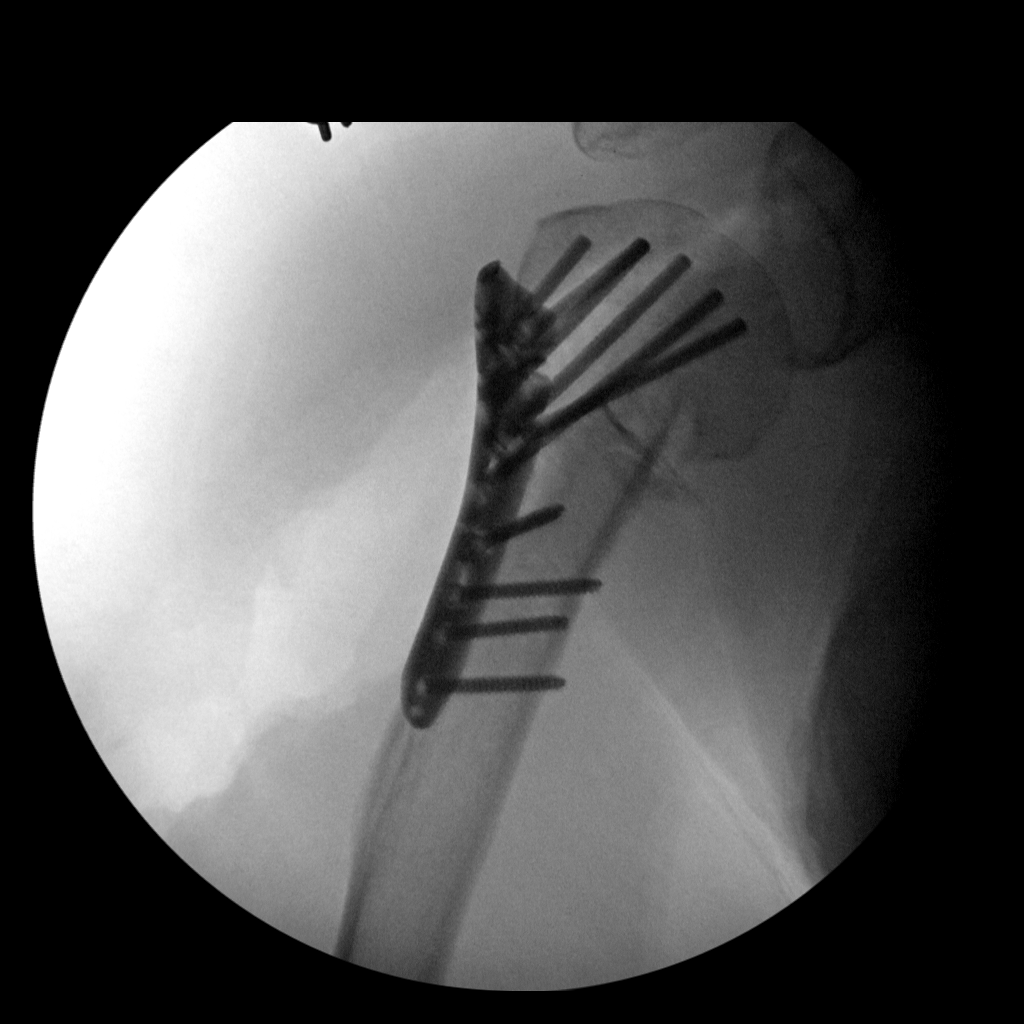
[im 2/2]
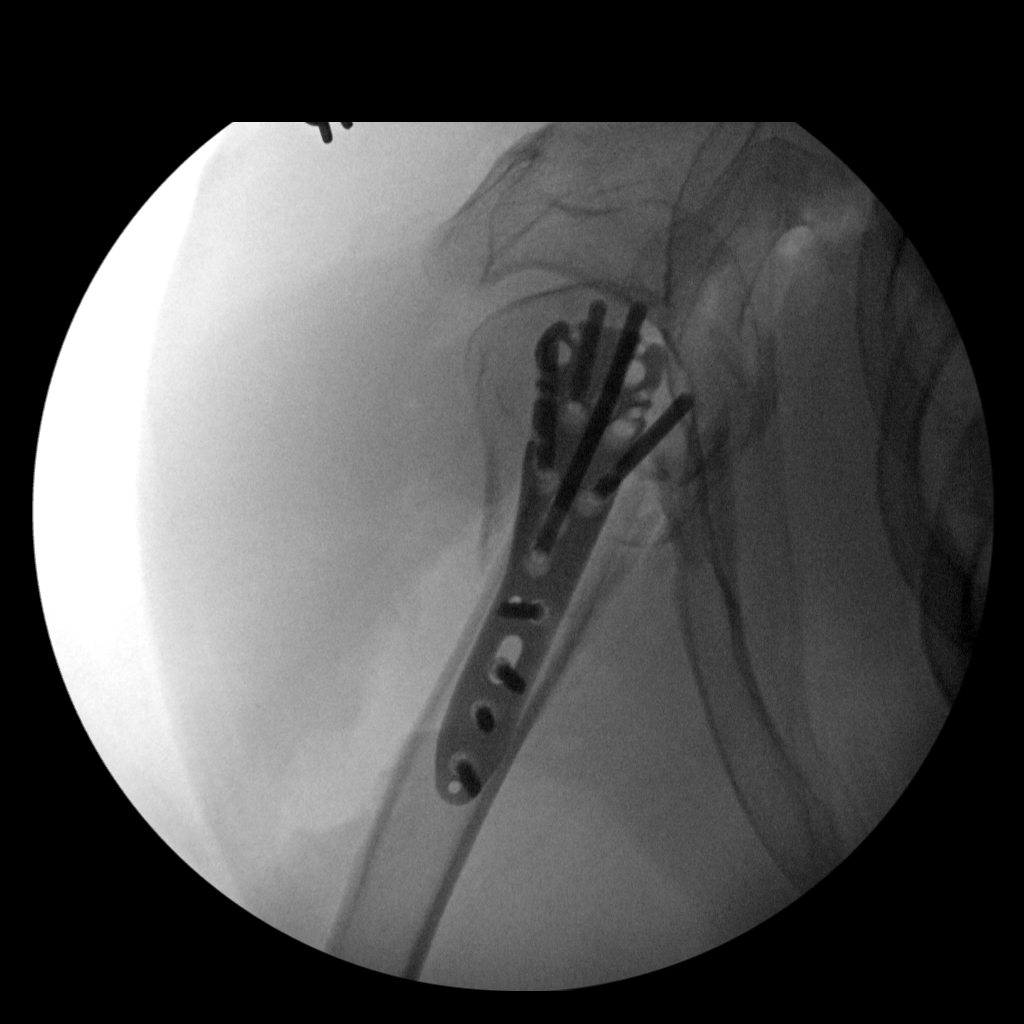

[2 of 2 positions shown; findings below may reference images not displayed]

FINDINGS: The patient has undergone open reduction and internal fixation of
the comminuted fracture of the proximal right humerus. There is
improved alignment and position of the multiple fracture fragments.
Side plate and multiple screws are in place.
IMPRESSION: Open reduction and internal fixation of comminuted fracture of the
proximal right humerus.

## 2018-05-03 ENCOUNTER — Other Ambulatory Visit (INDEPENDENT_AMBULATORY_CARE_PROVIDER_SITE_OTHER): Payer: Self-pay | Admitting: Orthopaedic Surgery

## 2018-05-28 ENCOUNTER — Other Ambulatory Visit: Payer: Self-pay | Admitting: Physician Assistant

## 2018-06-06 ENCOUNTER — Ambulatory Visit (INDEPENDENT_AMBULATORY_CARE_PROVIDER_SITE_OTHER): Payer: BLUE CROSS/BLUE SHIELD | Admitting: Pharmacist

## 2018-06-06 ENCOUNTER — Ambulatory Visit (HOSPITAL_COMMUNITY)
Admission: RE | Admit: 2018-06-06 | Discharge: 2018-06-06 | Disposition: A | Discharge status: Home / Self Care | Payer: BLUE CROSS/BLUE SHIELD | Source: Ambulatory Visit | Attending: Cardiology | Admitting: Cardiology

## 2018-06-06 VITALS — Ht 63.0 in | Wt 252.6 lb

## 2018-06-06 DIAGNOSIS — E785 Hyperlipidemia, unspecified: Secondary | ICD-10-CM

## 2018-06-06 DIAGNOSIS — R1900 Intra-abdominal and pelvic swelling, mass and lump, unspecified site: Secondary | ICD-10-CM | POA: Diagnosis not present

## 2018-06-06 NOTE — Patient Instructions (Addendum)
Lipid Clinic (pharmacist) Dezman Granda/Kristin [phone]  *START paperwork for Repatha 420mg  every month* *Continue working on lifestyle modifications* *Repatha.com --> Paying for repatha --> co-pay card*  Cholesterol Cholesterol is a fat. Your body needs a small amount of cholesterol. Cholesterol (plaque) may build up in your blood vessels (arteries). That makes you more likely to have a heart attack or stroke. You cannot feel your cholesterol level. Having a blood test is the only way to find out if your level is high. Keep your test results. Work with your doctor to keep your cholesterol at a good level. What do the results mean?  Total cholesterol is how much cholesterol is in your blood.  LDL is bad cholesterol. This is the type that can build up. Try to have low LDL.  HDL is good cholesterol. It cleans your blood vessels and carries LDL away. Try to have high HDL.  Triglycerides are fat that the body can store or burn for energy. What are good levels of cholesterol?  Total cholesterol below 200.  LDL below 100 is good for people who have health risks. LDL below 70 is good for people who have very high risks.  HDL above 40 is good. It is best to have HDL of 60 or higher.  Triglycerides below 150. How can I lower my cholesterol? Diet Follow your diet program as told by your doctor.  Choose fish, white meat chicken, or [place]urkey that is roasted or baked. Try not to eat red meat, fried foods, sausage, or lunch meats.  Eat lots of fresh fruits and vegetables.  Choose whole grains, beans, pasta, potatoes, and cereals.  Choose olive oil, corn oil, or canola oil. Only use small amounts.  Try not to eat butter, mayonnaise, shortening, or palm kernel oils.  Try not to eat foods with trans fats.  Choose low-fat or nonfat dairy foods. ? Drink skim or nonfat milk. ? Eat low-fat or nonfat yogurt and cheeses. ? Try not to drink whole milk or cream. ? Try not to eat ice cream, egg  yolks, or full-fat cheeses.  Healthy desserts include angel food cake, ginger snaps, animal crackers, hard candy, popsicles, and low-fat or nonfat frozen yogurt. Try not to eat pastries, cakes, pies, and cookies.  Exercise Follow your exercise program as told by your doctor.  Be more active. Try gardening, walking, and taking the stairs.  Ask your doctor about ways that you can be more active.  Medicine  Take over-the-counter and prescription medicines only as told by your doctor. This information is not intended to replace advice given to you by your health care provider. Make sure you discuss any questions you have with your health care provider. Document Released: 12/03/2008 Document Revised: 04/07/2016 Document Reviewed: 03/18/2016 Elsevier Interactive Patient Education  [employer]2018 [employer]

## 2018-06-06 NOTE — Progress Notes (Signed)
Patient ID: [name]                 DOB: 11/17/1960                    MRN: [id number]     HPI: [name] is a 57 y.o. female patient referred to lipid clinic by Dr Antoine PocheHochrein. PMH is significant for CAD s/p MI x 2, hyperlipidemia, hypertension, headaches, and GERD. Noted 1st MI at age 57, and baseline LDL of 192 mg/dL meeting criteria for familia hyperlipidemia. Patient reports compliance with rosuvastatin and ezetimibe therapy. Continues to work on lifestyle modifications including working with nutritionist for [employer].  Current Medications:  Rosuvastatin 40 mg Ezetimibe 10 mg   Intolerances: None  LDL goal: <70  Diet: Does not have a healthy diet currently.  Exercise: Walks around the block about 2 times per week with her dog.   Family History:  Mother w/ hypertriglyceridemia and 5 aortic aneurism; Father had approximately myocardial infarctions, first MI at 7230. Two uncles who passed away from MI.  Social History: Current smoker, married with no children  Labs: 01/28/2018: CHO 182; TG 197; HDL 42; LDL-c 101 (rosuvsatatin plus ezetimibe)  Past Medical History:  Diagnosis Date  . CAD (coronary artery disease)    cypher stent  . GERD (gastroesophageal reflux disease)   . Headache   . Heart attack (HCC)    x 2  . HLD (hyperlipidemia)   . HTN (hypertension)   . Humerus fracture   . Obesity   . Tinnitus     Current Outpatient Medications on File Prior to Visit  Medication Sig Dispense Refill  . clopidogrel (PLAVIX) 75 MG tablet TAKE 1 TABLET BY MOUTH EVERY DAY 90 tablet 3  . CVS ZINC 50 MG tablet TAKE 1 TABLET BY MOUTH EVERY DAY 100 tablet 0  . diclofenac (VOLTAREN) 75 MG EC tablet Take 1 tablet (75 mg total) by mouth 2 (two) times daily. 60 tablet 6  . diclofenac sodium (VOLTAREN) 1 % GEL APPLY 2 GRAMS TO AFFECTED AREA 4 TIMES A DAY 100 g 5  . folic acid (FOLVITE) 1 MG tablet TAKE 1 TABLET BY MOUTH EVERY DAY 30 tablet 5  . furosemide (LASIX) 40  MG tablet TAKE 2 TABLETS BY MOUTH EVERY MORNING AND 1 TABLETS IN THE EVENING 270 tablet 3  . isosorbide mononitrate (IMDUR) 30 MG 24 hr tablet TAKE 1 TABLET BY MOUTH EVERY DAY 90 tablet 3  . methocarbamol (ROBAXIN) 750 MG tablet Take 1 tablet (750 mg total) by mouth 2 (two) times daily as needed for muscle spasms. 60 tablet 0  . metoprolol tartrate (LOPRESSOR) 25 MG tablet TAKE 1 TABLET BY MOUTH TWICE A DAY 180 tablet 3  . nitroGLYCERIN (NITROSTAT) 0.4 MG SL tablet Place 1 tablet (0.4 mg total) under the tongue every 5 (five) minutes as needed (chest pain). 25 tablet 3  . pantoprazole (PROTONIX) 40 MG tablet TAKE 1 TABLET BY MOUTH EVERY DAY 90 tablet 3  . ramipril (ALTACE) 2.5 MG capsule TAKE 1 CAPSULE (2.5 MG TOTAL) BY MOUTH DAILY. 90 capsule 3  . rosuvastatin (CRESTOR) 40 MG tablet TAKE 1 TABLET BY MOUTH EVERY DAY 90 tablet 3  . calcium-vitamin D (OSCAL WITH D) 500-200 MG-UNIT tablet Take 1 tablet by mouth 3 (three) times daily. (Patient not taking: Reported on 06/06/2018) 90 tablet 12  . diphenhydrAMINE (BENADRYL) 50 MG tablet Take 1 tablet (50 mg total) by mouth at bedtime as  needed for itching. (Patient not taking: Reported on 06/06/2018) 1 tablet 0  . docusate sodium (COLACE) 100 MG capsule Take 1 capsule (100 mg total) by mouth every 12 (twelve) hours. (Patient not taking: Reported on 06/06/2018) 60 capsule 0  . ezetimibe (ZETIA) 10 MG tablet Take 1 tablet (10 mg total) by mouth daily. 90 tablet 3  . isosorbide mononitrate (IMDUR) 60 MG 24 hr tablet Take 1 tablet (60 mg total) by mouth daily. (Patient not taking: Reported on 06/06/2018) 90 tablet 3   No current facility-administered medications on file prior to visit.     Allergies  Allergen Reactions  . Iodine Swelling and Rash    SWELLING REACTION UNSPECIFIED       HLD (hyperlipidemia) LDL remains above goal for secondary prevention. Patient reports compliance with rosuvastatin and ezetimibe regimen. Currently working with nutritionist  for weight management as well. Also noted, LDL >190mg /dL and strong family history of early ACSVD including MI in father at age 52.    Will initiate paperwork for Repatha 420mg . PCSK9i indication, MOA, common side effects, storage, administration, monitoring, PA process, and financial responsibilities were discussed during this office visit as well. Plan to repeat fasting blood work and LFTs after 3rd dose of Repatha 420mg , then follow up as needed with Lipid Clinic.   Holden Maniscalco Rodriguez-Guzman PharmD, BCPS, CPP [facility] [address] [place] [address] 06/09/2018 11:07 AM

## 2018-06-09 ENCOUNTER — Encounter: Payer: Self-pay | Admitting: Pharmacist

## 2018-06-09 NOTE — Assessment & Plan Note (Signed)
LDL remains above goal for secondary prevention. Patient reports compliance with rosuvastatin and ezetimibe regimen. Currently working with nutritionist for weight management as well. Also noted, LDL >190mg /dL and strong family history of early ACSVD including MI in father at age 56.    Will initiate paperwork for Repatha 420mg . PCSK9i indication, MOA, common side effects, storage, administration, monitoring, PA process, and financial responsibilities were discussed during this office visit as well. Plan to repeat fasting blood work and LFTs after 3rd dose of Repatha 420mg , then follow up as needed with Lipid Clinic.

## 2018-06-20 ENCOUNTER — Telehealth: Payer: Self-pay | Admitting: Pharmacist

## 2018-06-20 NOTE — Telephone Encounter (Signed)
PA for Repatha 420mg  monthly infusion was denies by insurance. Plan requires trial with Repatha 140mg  every 2 weeks.   Patient contacted in regards to change. She is concern about cost of 2 injections vs monthly infusion. After discussion , patient agrees to proceed with PA for Repatha 140mg .  Will submit PA request to insurance and follow up with patient as needed.

## 2018-06-27 ENCOUNTER — Other Ambulatory Visit: Payer: Self-pay | Admitting: Pharmacist

## 2018-06-27 MED ORDER — EVOLOCUMAB 140 MG/ML ~~LOC~~ SOAJ: 140.0000 mg | SUBCUTANEOUS | 11 refills | Status: DC

## 2018-06-27 NOTE — Telephone Encounter (Signed)
PA for Repatha 140mg  approved by insurance. Rx sent to specialty pharmacy

## 2018-08-01 ENCOUNTER — Telehealth: Payer: Self-pay | Admitting: Pharmacist

## 2018-08-01 NOTE — Telephone Encounter (Signed)
*   Patient has medication at hand but apprehensive about using REPATHA after INTERNET SEARCH *  I talked to patient for > 15 minutes. All questions were answered. Potential for ADR discussed. She will like to think about it a little longer.  *Emphasis placed on  reasons for LDL below 70mg /dL on high risk patients like her* Risks of additional cardiac events due to elevated cholesterol and LDL discussed AGAIN.   If patient start taking Repatha as prescribed, will plan to repeat Lipid panel during OV with DR Hoichrein in December.

## 2018-08-10 ENCOUNTER — Encounter: Payer: Self-pay | Admitting: Cardiology

## 2018-08-26 NOTE — Progress Notes (Deleted)
HPI The patient presents for evaluation of CAD.  She had her first heart attack at the age of 45 and it was treated with a stent to her left circumflex. In July 2006, she underwent her most recent catheterization for recurrent angina. This showed in-stent restenosis of the circumflex as well as 80% lesion in the LAD and first diagonal. She underwent Cypher drug-eluting stent for in-stent restenosis of the left circumflex as well as to the LAD. She had angioplasty of the second diagonal. ETT on 08/26/2015 showed no significant ST changes, she was only able to achieve 77% of the predicted heart rate as she took her beta blocker that morning. A coronary CTA, because of previous stenting and her body habitus, was not adequate to exclude obstructive coronary disease.  She did not want to have a cardiac cath  as her symptoms improved and she thought it was related to a dental problem as it would get better with Listerine.  She was therefore managed medically.  She returns for follow up. ***   The discomfort was up in her jaw and mouth and unlike previous angina.  What happens sporadically.  She actually has have not in the last 2 to 3 weeks.  She has not been active but she does work and does her chores of daily living.  She is not able to bring on the symptoms with this.  She has no new shortness of breath, PND or orthopnea.  She is not having any palpitations, presyncope or syncope.   Allergies  Allergen Reactions  . Iodine Swelling and Rash    SWELLING REACTION UNSPECIFIED       Current Outpatient Medications  Medication Sig Dispense Refill  . calcium-vitamin D (OSCAL WITH D) 500-200 MG-UNIT tablet Take 1 tablet by mouth 3 (three) times daily. (Patient not taking: Reported on 06/06/2018) 90 tablet 12  . clopidogrel (PLAVIX) 75 MG tablet TAKE 1 TABLET BY MOUTH EVERY DAY 90 tablet 3  . CVS ZINC 50 MG tablet TAKE 1 TABLET BY MOUTH EVERY DAY 100 tablet 0  . diclofenac (VOLTAREN) 75 MG EC tablet Take  1 tablet (75 mg total) by mouth 2 (two) times daily. 60 tablet 6  . diclofenac sodium (VOLTAREN) 1 % GEL APPLY 2 GRAMS TO AFFECTED AREA 4 TIMES A DAY 100 g 5  . diphenhydrAMINE (BENADRYL) 50 MG tablet Take 1 tablet (50 mg total) by mouth at bedtime as needed for itching. (Patient not taking: Reported on 06/06/2018) 1 tablet 0  . docusate sodium (COLACE) 100 MG capsule Take 1 capsule (100 mg total) by mouth every 12 (twelve) hours. (Patient not taking: Reported on 06/06/2018) 60 capsule 0  . Evolocumab (REPATHA SURECLICK) 140 MG/ML SOAJ Inject 140 mg into the skin every 14 (fourteen) days. 2 pen 11  . ezetimibe (ZETIA) 10 MG tablet Take 1 tablet (10 mg total) by mouth daily. 90 tablet 3  . folic acid (FOLVITE) 1 MG tablet TAKE 1 TABLET BY MOUTH EVERY DAY 30 tablet 5  . furosemide (LASIX) 40 MG tablet TAKE 2 TABLETS BY MOUTH EVERY MORNING AND 1 TABLETS IN THE EVENING 270 tablet 3  . isosorbide mononitrate (IMDUR) 30 MG 24 hr tablet TAKE 1 TABLET BY MOUTH EVERY DAY 90 tablet 3  . isosorbide mononitrate (IMDUR) 60 MG 24 hr tablet Take 1 tablet (60 mg total) by mouth daily. (Patient not taking: Reported on 06/06/2018) 90 tablet 3  . methocarbamol (ROBAXIN) 750 MG tablet Take 1 tablet (750  mg total) by mouth 2 (two) times daily as needed for muscle spasms. 60 tablet 0  . metoprolol tartrate (LOPRESSOR) 25 MG tablet TAKE 1 TABLET BY MOUTH TWICE A DAY 180 tablet 3  . nitroGLYCERIN (NITROSTAT) 0.4 MG SL tablet Place 1 tablet (0.4 mg total) under the tongue every 5 (five) minutes as needed (chest pain). 25 tablet 3  . pantoprazole (PROTONIX) 40 MG tablet TAKE 1 TABLET BY MOUTH EVERY DAY 90 tablet 3  . ramipril (ALTACE) 2.5 MG capsule TAKE 1 CAPSULE (2.5 MG TOTAL) BY MOUTH DAILY. 90 capsule 3  . rosuvastatin (CRESTOR) 40 MG tablet TAKE 1 TABLET BY MOUTH EVERY DAY 90 tablet 3   No current facility-administered medications for this visit.     Past Medical History:  Diagnosis Date  . CAD (coronary artery  disease)    cypher stent  . GERD (gastroesophageal reflux disease)   . Headache   . Heart attack (HCC)    x 2  . HLD (hyperlipidemia)   . HTN (hypertension)   . Humerus fracture   . Obesity   . Tinnitus     Past Surgical History:  Procedure Laterality Date  . APPENDECTOMY    . CARDIAC CATHETERIZATION    . coronary stents     x 4  . EYE SURGERY     lasik  . FRACTURE SURGERY     left ankle  . MULTIPLE TOOTH EXTRACTIONS    . ORIF HUMERUS FRACTURE Right 04/01/2017   Procedure: OPEN REDUCTION INTERNAL FIXATION (ORIF) RIGHT PROXIMAL HUMERUS FRACTURE;  Surgeon: Tarry KosXu, Naiping M, MD;  Location: [facility] OR;  Service: Orthopedics;  Laterality: Right;    ROS: Positive for neuropathic foot pain. ***   Otherwise as stated in the HPI and negative for all other systems.  PHYSICAL EXAM There were no vitals taken for this visit.  GENERAL:  Well appearing NECK:  No jugular venous distention, waveform within normal limits, carotid upstroke brisk and symmetric, no bruits, no thyromegaly LUNGS:  Clear to auscultation bilaterally CHEST:  Unremarkable HEART:  PMI not displaced or sustained,S1 and S2 within normal limits, no S3, no S4, no clicks, no rubs, *** murmurs ABD:  Flat, positive bowel sounds normal in frequency in pitch, no bruits, no rebound, no guarding, no midline pulsatile mass, no hepatomegaly, no splenomegaly EXT:  2 plus pulses throughout, no edema, no cyanosis no clubbing    ***GENERAL:  Well appearing NECK:  No jugular venous distention, waveform within normal limits, carotid upstroke brisk and symmetric, no bruits, no thyromegaly LUNGS:  Clear to auscultation bilaterally CHEST:  Unremarkable HEART:  PMI not displaced or sustained,S1 and S2 within normal limits, no S3, no S4, no clicks, no rubs, no murmurs ABD:  Flat, positive bowel sounds normal in frequency in pitch, no bruits, no rebound, no guarding, possible midline pulsatile mass, no hepatomegaly, no splenomegaly EXT:  2 plus  pulses throughout, no edema, no cyanosis no clubbing   EKG:   ***  ASSESSMENT AND PLAN  CHEST PAIN:      *** We had a long discussion about this.  Her chest pain is actually gone for to 3 weeks.  It was atypical to begin with compared to her previous angina.  She would prefer to defer catheterization and since her symptoms are improved at this point there is not a clear indication.  However, if she has any recurrent chest discomfort she agrees to call me at which point we would pursue cardiac catheterization.  In  the meantime she needs aggressive secondary risk reduction.  CORONARY ARTERY DISEASE -  ***    HYPERLIPIDEMIA -  She is being followed in the Lipid Clinic and is being treated with PCSK9 inhibitors.  *** I will refer her to the Lipid Clinic to consider PCSK9 since she is on max therapy and is not at target.    HTN - BP runs low.  *** No med titration.    TOBACCO USER -  ***  She was counseled today.   NEUROPATHY - ***  She seems to describe neuropathic pain.

## 2018-08-29 ENCOUNTER — Ambulatory Visit: Payer: BLUE CROSS/BLUE SHIELD | Admitting: Cardiology

## 2018-10-01 NOTE — Progress Notes (Deleted)
HPI The patient presents for evaluation of CAD.  She had her first heart attack at the age of 58 and it was treated with a stent to her left circumflex. In July 2006, she underwent her most recent catheterization for recurrent angina. This showed in-stent restenosis of the circumflex as well as 80% lesion in the LAD and first diagonal. She underwent Cypher drug-eluting stent for in-stent restenosis of the left circumflex as well as to the LAD. She had angioplasty of the second diagonal. ETT on 08/26/2015 showed no significant ST changes, she was only able to achieve 77% of the predicted heart rate as she took her beta blocker that morning.  I saw her in May of last year and she had some chest pain.  A coronary CTA.  However, because of previous stenting and her body habitus this  was not adequate to exclude obstructive coronary disease.  I called her up to discuss cardiac catheterization.  However, she really did not want to have this as her symptoms improved and she thought it was related to a dental problem as it would get better with Listerine.  No cath was pursued.  ***   The discomfort was up in her jaw and mouth and unlike previous angina.  What happens sporadically.  She actually has have not in the last 2 to 3 weeks.  She has not been active but she does work and does her chores of daily living.  She is not able to bring on the symptoms with this.  She has no new shortness of breath, PND or orthopnea.  She is not having any palpitations, presyncope or syncope.   Allergies  Allergen Reactions  . Iodine Swelling and Rash    SWELLING REACTION UNSPECIFIED       Current Outpatient Medications  Medication Sig Dispense Refill  . calcium-vitamin D (OSCAL WITH D) 500-200 MG-UNIT tablet Take 1 tablet by mouth 3 (three) times daily. (Patient not taking: Reported on 06/06/2018) 90 tablet 12  . clopidogrel (PLAVIX) 75 MG tablet TAKE 1 TABLET BY MOUTH EVERY DAY 90 tablet 3  . CVS ZINC 50 MG tablet  TAKE 1 TABLET BY MOUTH EVERY DAY 100 tablet 0  . diclofenac (VOLTAREN) 75 MG EC tablet Take 1 tablet (75 mg total) by mouth 2 (two) times daily. 60 tablet 6  . diclofenac sodium (VOLTAREN) 1 % GEL APPLY 2 GRAMS TO AFFECTED AREA 4 TIMES A DAY 100 g 5  . diphenhydrAMINE (BENADRYL) 50 MG tablet Take 1 tablet (50 mg total) by mouth at bedtime as needed for itching. (Patient not taking: Reported on 06/06/2018) 1 tablet 0  . docusate sodium (COLACE) 100 MG capsule Take 1 capsule (100 mg total) by mouth every 12 (twelve) hours. (Patient not taking: Reported on 06/06/2018) 60 capsule 0  . Evolocumab (REPATHA SURECLICK) 140 MG/ML SOAJ Inject 140 mg into the skin every 14 (fourteen) days. 2 pen 11  . ezetimibe (ZETIA) 10 MG tablet Take 1 tablet (10 mg total) by mouth daily. 90 tablet 3  . folic acid (FOLVITE) 1 MG tablet TAKE 1 TABLET BY MOUTH EVERY DAY 30 tablet 5  . furosemide (LASIX) 40 MG tablet TAKE 2 TABLETS BY MOUTH EVERY MORNING AND 1 TABLETS IN THE EVENING 270 tablet 3  . isosorbide mononitrate (IMDUR) 30 MG 24 hr tablet TAKE 1 TABLET BY MOUTH EVERY DAY 90 tablet 3  . isosorbide mononitrate (IMDUR) 60 MG 24 hr tablet Take 1 tablet (60 mg total) by  mouth daily. (Patient not taking: Reported on 06/06/2018) 90 tablet 3  . methocarbamol (ROBAXIN) 750 MG tablet Take 1 tablet (750 mg total) by mouth 2 (two) times daily as needed for muscle spasms. 60 tablet 0  . metoprolol tartrate (LOPRESSOR) 25 MG tablet TAKE 1 TABLET BY MOUTH TWICE A DAY 180 tablet 3  . nitroGLYCERIN (NITROSTAT) 0.4 MG SL tablet Place 1 tablet (0.4 mg total) under the tongue every 5 (five) minutes as needed (chest pain). 25 tablet 3  . pantoprazole (PROTONIX) 40 MG tablet TAKE 1 TABLET BY MOUTH EVERY DAY 90 tablet 3  . ramipril (ALTACE) 2.5 MG capsule TAKE 1 CAPSULE (2.5 MG TOTAL) BY MOUTH DAILY. 90 capsule 3  . rosuvastatin (CRESTOR) 40 MG tablet TAKE 1 TABLET BY MOUTH EVERY DAY 90 tablet 3   No current facility-administered medications  for this visit.     Past Medical History:  Diagnosis Date  . CAD (coronary artery disease)    cypher stent  . GERD (gastroesophageal reflux disease)   . Headache   . Heart attack (HCC)    x 2  . HLD (hyperlipidemia)   . HTN (hypertension)   . Humerus fracture   . Obesity   . Tinnitus     Past Surgical History:  Procedure Laterality Date  . APPENDECTOMY    . CARDIAC CATHETERIZATION    . coronary stents     x 4  . EYE SURGERY     lasik  . FRACTURE SURGERY     left ankle  . MULTIPLE TOOTH EXTRACTIONS    . ORIF HUMERUS FRACTURE Right 04/01/2017   Procedure: OPEN REDUCTION INTERNAL FIXATION (ORIF) RIGHT PROXIMAL HUMERUS FRACTURE;  Surgeon: Tarry Kos, MD;  Location: [facility] OR;  Service: Orthopedics;  Laterality: Right;    ROS: Positive ***.   Otherwise as stated in the HPI and negative for all other systems.  PHYSICAL EXAM There were no vitals taken for this visit.  GENERAL:  Well appearing NECK:  No jugular venous distention, waveform within normal limits, carotid upstroke brisk and symmetric, no bruits, no thyromegaly LUNGS:  Clear to auscultation bilaterally CHEST:  Unremarkable HEART:  PMI not displaced or sustained,S1 and S2 within normal limits, no S3, no S4, no clicks, no rubs, *** murmurs ABD:  Flat, positive bowel sounds normal in frequency in pitch, no bruits, no rebound, no guarding, no midline pulsatile mass, no hepatomegaly, no splenomegaly EXT:  2 plus pulses throughout, no edema, no cyanosis no clubbing    ***GENERAL:  Well appearing NECK:  No jugular venous distention, waveform within normal limits, carotid upstroke brisk and symmetric, no bruits, no thyromegaly LUNGS:  Clear to auscultation bilaterally CHEST:  Unremarkable HEART:  PMI not displaced or sustained,S1 and S2 within normal limits, no S3, no S4, no clicks, no rubs, no murmurs ABD:  Flat, positive bowel sounds normal in frequency in pitch, no bruits, no rebound, no guarding, possible midline  pulsatile mass, no hepatomegaly, no splenomegaly EXT:  2 plus pulses throughout, no edema, no cyanosis no clubbing   EKG:   ***   ASSESSMENT AND PLAN  CHEST PAIN - ***     We had a long discussion about this.  Her chest pain is actually gone for to 3 weeks.  It was atypical to begin with compared to her previous angina.  She would prefer to defer catheterization and since her symptoms are improved at this point there is not a clear indication.  However, if she has  any recurrent chest discomfort she agrees to call me at which point we would pursue cardiac catheterization.  In the meantime she needs aggressive secondary risk reduction.  CORONARY ARTERY DISEASE -  ***  As above.    HYPERLIPIDEMIA -  She is followed in the lipid clinic.  ***   I will refer her to the Lipid Clinic to consider PCSK9 since she is on max therapy and is not at target.    HTN - ***  BP runs low.  No med titration.    TOBACCO USER -  ***  She was counseled today.

## 2018-10-03 ENCOUNTER — Ambulatory Visit: Payer: Self-pay | Admitting: Cardiology

## 2018-10-26 ENCOUNTER — Ambulatory Visit (INDEPENDENT_AMBULATORY_CARE_PROVIDER_SITE_OTHER): Payer: 59 | Admitting: Cardiology

## 2018-10-26 ENCOUNTER — Encounter: Payer: Self-pay | Admitting: Cardiology

## 2018-10-26 ENCOUNTER — Other Ambulatory Visit: Payer: Self-pay | Admitting: Cardiology

## 2018-10-26 VITALS — BP 120/64 | HR 65 | Ht 63.0 in | Wt 251.8 lb

## 2018-10-26 DIAGNOSIS — Z9861 Coronary angioplasty status: Secondary | ICD-10-CM

## 2018-10-26 DIAGNOSIS — R079 Chest pain, unspecified: Secondary | ICD-10-CM | POA: Insufficient documentation

## 2018-10-26 DIAGNOSIS — I10 Essential (primary) hypertension: Secondary | ICD-10-CM | POA: Diagnosis not present

## 2018-10-26 DIAGNOSIS — I2510 Atherosclerotic heart disease of native coronary artery without angina pectoris: Secondary | ICD-10-CM | POA: Diagnosis not present

## 2018-10-26 DIAGNOSIS — E785 Hyperlipidemia, unspecified: Secondary | ICD-10-CM | POA: Diagnosis not present

## 2018-10-26 MED ORDER — ISOSORBIDE MONONITRATE ER 60 MG PO TB24: 60.0000 mg | ORAL_TABLET | Freq: Every day | ORAL | 6 refills | Status: DC

## 2018-10-26 NOTE — Assessment & Plan Note (Signed)
Followed by Lipid clinic

## 2018-10-26 NOTE — Assessment & Plan Note (Signed)
BMI 44 

## 2018-10-26 NOTE — Assessment & Plan Note (Signed)
Controlled.  

## 2018-10-26 NOTE — Assessment & Plan Note (Signed)
Her symptoms are concerning enough to warrant cath- initially she was reluctant but when I explained it would be done radially she is agreeable.

## 2018-10-26 NOTE — Patient Instructions (Addendum)
Medication Instructions:  START Imdur 60mg  Take 1 tablet once a day If you need a refill on your cardiac medications before your next appointment, please call your pharmacy.   Lab work: Your physician recommends that you return for lab work in: TODAY-BMET, CBC If you have labs (blood work) drawn today and your tests are completely normal, you will receive your results only by: Marland Kitchen MyChart Message (if you have MyChart) OR . A paper copy in the mail If you have any lab test that is abnormal or we need to change your treatment, we will call you to review the results.  Testing/Procedures: Your physician has requested that you have a cardiac catheterization. Cardiac catheterization is used to diagnose and/or treat various heart conditions. Doctors may recommend this procedure for a number of different reasons. The most common reason is to evaluate chest pain. Chest pain can be a symptom of coronary artery disease (CAD), and cardiac catheterization can show whether plaque is narrowing or blocking your heart's arteries. This procedure is also used to evaluate the valves, as well as measure the blood flow and oxygen levels in different parts of your heart. For further information please visit [email or web address] Please follow instruction sheet, as given. SEE INSTRUCTIONS BELOW  Follow-Up: At [facility], you and your health needs are our priority.  As part of our continuing mission to provide you with exceptional heart care, we have created designated Provider Care Teams.  These Care Teams include your primary Cardiologist (physician) and Advanced Practice Providers (APPs -  Physician Assistants and Nurse Practitioners) who all work together to provide you with the care you need, when you need it. . Your physician recommends that you schedule a follow-up appointment in: 6-8 WEEKS WITH DR [facility]  Any Other Special Instructions Will Be Listed Below (If Applicable).      [facility]  [facility] CARDIOVASCULAR DIVISION [facility] [address] [place] [address] [place] [address] Dept: [phone] Loc: [phone]  LAKIEA VERON  10/26/2018  You are scheduled for a Cardiac Catheterization on Tuesday, February 11 with Dr. Bryan Lemma.  1. Please arrive at the [facility] (Main Entrance A) at [facility]: [address] [place], [place] [address] at 1:00 PM (This time is two hours before your procedure to ensure your preparation). Free valet parking service is available.   Special note: Every effort is made to have your procedure done on time. Please understand that emergencies sometimes delay scheduled procedures.  2. Diet: Do not eat solid foods after midnight.  The patient may have clear liquids until 5am upon the day of the procedure. CAN HAVE WATER UP TO 4 HOURS BEFORE PROCEDURE.  3. Labs: COMPLETED TODAY  4. Medication instructions in preparation for your procedure:   Contrast Allergy: No  DO NOT TAKE LASIX AND ALTACE PRIOR TO CATH  On the morning of your procedure, take your Aspirin and any morning medicines NOT listed above.  You may use sips of water.  5. Plan for one night stay--bring personal belongings. 6. Bring a current list of your medications and current insurance cards. 7. You MUST have a responsible person to drive you home. 8. Someone MUST be with you the first 24 hours after you arrive home or your discharge will be delayed. 9. Please wear clothes that are easy to get on and off and wear slip-on shoes.  Thank you for allowing [place] to care for you!   -- [facility]

## 2018-10-26 NOTE — Progress Notes (Signed)
10/26/2018 [name]   1960-11-04  [id number]  Primary Physician Gordy Savers, MD Primary Cardiologist: Dr Antoine Poche  HPI: Patient is a 58 year old female followed by Dr. Antoine Poche with a history of coronary disease since her 30s.  She had her first heart attack at the age of 89 and it was treated with a stent to her left circumflex. In July 2006, she underwent her most recent catheterization for recurrent angina. This showed in-stent restenosis of the circumflex as well as 80% lesion in the LAD and first diagonal. She underwent Cypher drug-eluting stent for in-stent restenosis of the left circumflex as well as to the LAD. Dr Antoine Poche saw her this past summer and she complained of chest pain and jaw pain.  Coronary CT was done July 2019.  However, because of previous stenting and her body habitus this  was not adequate to exclude obstructive coronary disease.  Dr Antoine Poche saw her in follow up to discuss catheterization but her symptoms had improved.  She is in the office today for a routine follow up.  Initially she said she was fine, then told me about an episode of jaw pain and chest pain two days ago that made her "sweat" it was so bad. She took a NTG with relief.  She admits she hasn't taken NTG in > 10 years. The next day (two days ago) she had another episode while at rest in the morning.  "I though I was having a heart attack". Again her symptoms subsided with SL NTG.  She did not have any chest pain yesterday or so far today.  She admits to some stress at home, her father in law is at home on Hospice.    Current Outpatient Medications  Medication Sig Dispense Refill  . calcium-vitamin D (OSCAL WITH D) 500-200 MG-UNIT tablet Take 1 tablet by mouth 3 (three) times daily. 90 tablet 12  . clopidogrel (PLAVIX) 75 MG tablet TAKE 1 TABLET BY MOUTH EVERY DAY 90 tablet 3  . [facility] ZINC 50 MG tablet TAKE 1 TABLET BY MOUTH EVERY DAY 100 tablet 0  . diclofenac (VOLTAREN) 75 MG EC tablet  Take 1 tablet (75 mg total) by mouth 2 (two) times daily. 60 tablet 6  . diclofenac sodium (VOLTAREN) 1 % GEL APPLY 2 GRAMS TO AFFECTED AREA 4 TIMES A DAY 100 g 5  . diphenhydrAMINE (BENADRYL) 50 MG tablet Take 1 tablet (50 mg total) by mouth at bedtime as needed for itching. 1 tablet 0  . docusate sodium (COLACE) 100 MG capsule Take 1 capsule (100 mg total) by mouth every 12 (twelve) hours. 60 capsule 0  . ezetimibe (ZETIA) 10 MG tablet Take 1 tablet (10 mg total) by mouth daily. 90 tablet 3  . folic acid (FOLVITE) 1 MG tablet TAKE 1 TABLET BY MOUTH EVERY DAY 30 tablet 5  . furosemide (LASIX) 40 MG tablet TAKE 2 TABLETS BY MOUTH EVERY MORNING AND 1 TABLETS IN THE EVENING 270 tablet 3  . methocarbamol (ROBAXIN) 750 MG tablet Take 1 tablet (750 mg total) by mouth 2 (two) times daily as needed for muscle spasms. 60 tablet 0  . metoprolol tartrate (LOPRESSOR) 25 MG tablet TAKE 1 TABLET BY MOUTH TWICE A DAY 180 tablet 3  . nitroGLYCERIN (NITROSTAT) 0.4 MG SL tablet Place 1 tablet (0.4 mg total) under the tongue every 5 (five) minutes as needed (chest pain). 25 tablet 3  . pantoprazole (PROTONIX) 40 MG tablet TAKE 1 TABLET BY MOUTH EVERY DAY  90 tablet 3  . ramipril (ALTACE) 2.5 MG capsule TAKE 1 CAPSULE (2.5 MG TOTAL) BY MOUTH DAILY. 90 capsule 3  . rosuvastatin (CRESTOR) 40 MG tablet TAKE 1 TABLET BY MOUTH EVERY DAY 90 tablet 3  . isosorbide mononitrate (IMDUR) 60 MG 24 hr tablet Take 1 tablet (60 mg total) by mouth daily. 30 tablet 6   No current facility-administered medications for this visit.     Allergies  Allergen Reactions  . Iodine Swelling and Rash    SWELLING REACTION UNSPECIFIED       Past Medical History:  Diagnosis Date  . CAD (coronary artery disease)    cypher stent  . GERD (gastroesophageal reflux disease)   . Headache   . Heart attack (HCC)    x 2  . HLD (hyperlipidemia)   . HTN (hypertension)   . Humerus fracture   . Obesity   . Tinnitus     Social History    Socioeconomic History  . Marital status: Married    Spouse name: Not on file  . Number of children: Not on file  . Years of education: Not on file  . Highest education level: Not on file  Occupational History  . Occupation: Development worker, communitymortgage loan officer  Social Needs  . Financial resource strain: Not on file  . Food insecurity:    Worry: Not on file    Inability: Not on file  . Transportation needs:    Medical: Not on file    Non-medical: Not on file  Tobacco Use  . Smoking status: Former Smoker    Packs/day: 2.00    Types: Cigarettes  . Smokeless tobacco: Never Used  . Tobacco comment: 2 ppd since age 58  Substance and Sexual Activity  . Alcohol use: Not Currently    Comment: occ  . Drug use: No  . Sexual activity: Not on file  Lifestyle  . Physical activity:    Days per week: Not on file    Minutes per session: Not on file  . Stress: Not on file  Relationships  . Social connections:    Talks on phone: Not on file    Gets together: Not on file    Attends religious service: Not on file    Active member of club or organization: Not on file    Attends meetings of clubs or organizations: Not on file    Relationship status: Not on file  . Intimate partner violence:    Fear of current or ex partner: Not on file    Emotionally abused: Not on file    Physically abused: Not on file    Forced sexual activity: Not on file  Other Topics Concern  . Not on file  Social History Narrative  . Not on file     Family History  Problem Relation Age of Onset  . Heart attack Father 1530       died at age 57  . Heart attack Mother   . Aortic aneurysm Mother        died of complication after surgery     Review of Systems: General: negative for chills, fever, night sweats or weight changes.  Cardiovascular: negative for dyspnea on exertion, edema, orthopnea, palpitations, paroxysmal nocturnal dyspnea or shortness of breath Dermatological: negative for rash Respiratory: negative for  cough or wheezing Urologic: negative for hematuria Abdominal: negative for nausea, vomiting, diarrhea, bright red blood per rectum, melena, or hematemesis Neurologic: negative for visual changes, syncope, or dizziness All other  systems reviewed and are otherwise negative except as noted above.    Blood pressure 120/64, pulse 65, height 5\' 3"  (1.6 m), weight 251 lb 12.8 oz (114.2 kg).  General appearance: alert, cooperative, no distress and moderately obese Neck: no carotid bruit and no JVD Lungs: clear to auscultation bilaterally Heart: regular rate and rhythm Abdomen: obese Extremities: no LE edema Pulses: 2+ Rt RA pulse Skin: Skin color, texture, turgor normal. No rashes or lesions Neurologic: Grossly normal  EKG NSR  ASSESSMENT AND PLAN:   Chest pain with moderate risk of acute coronary syndrome Her symptoms are concerning enough to warrant cath- initially she was reluctant but when I explained it would be done radially she is agreeable.   CAD S/P percutaneous coronary angioplasty H/O remote CFX PCI Cath 2006- ISR CFX and LAD disease- both sites Rx'd with DES.  Essential hypertension Controlled  Dyslipidemia, goal LDL below 70 Followed by Lipid clinic  Morbid obesity (HCC) BMI 44   PLAN  Increase Imdur to 60 mg.  Plan cath as an OP. Her husband was present during this interview and exam.  The patient understands that risks included but are not limited to stroke (1 in 1000), death (1 in 1000), kidney failure [usually temporary] (1 in 500), bleeding (1 in 200), allergic reaction [possibly serious] (1 in 200).  The patient understands and agrees to proceed.   Corine ShelterLuke Varnell Donate PA-C 10/26/2018 4:50 PM

## 2018-10-26 NOTE — Assessment & Plan Note (Signed)
H/O remote CFX PCI Cath 2006- ISR CFX and LAD disease- both sites Rx'd with DES.

## 2018-10-26 NOTE — H&P (View-Only) (Signed)
10/26/2018 [name]   1960-11-04  [id number]  Primary Physician Gordy Savers, MD Primary Cardiologist: Dr Antoine Poche  HPI: Patient is a 58 year old female followed by Dr. Antoine Poche with a history of coronary disease since her 30s.  She had her first heart attack at the age of 89 and it was treated with a stent to her left circumflex. In July 2006, she underwent her most recent catheterization for recurrent angina. This showed in-stent restenosis of the circumflex as well as 80% lesion in the LAD and first diagonal. She underwent Cypher drug-eluting stent for in-stent restenosis of the left circumflex as well as to the LAD. Dr Antoine Poche saw her this past summer and she complained of chest pain and jaw pain.  Coronary CT was done July 2019.  However, because of previous stenting and her body habitus this  was not adequate to exclude obstructive coronary disease.  Dr Antoine Poche saw her in follow up to discuss catheterization but her symptoms had improved.  She is in the office today for a routine follow up.  Initially she said she was fine, then told me about an episode of jaw pain and chest pain two days ago that made her "sweat" it was so bad. She took a NTG with relief.  She admits she hasn't taken NTG in > 10 years. The next day (two days ago) she had another episode while at rest in the morning.  "I though I was having a heart attack". Again her symptoms subsided with SL NTG.  She did not have any chest pain yesterday or so far today.  She admits to some stress at home, her father in law is at home on Hospice.    Current Outpatient Medications  Medication Sig Dispense Refill  . calcium-vitamin D (OSCAL WITH D) 500-200 MG-UNIT tablet Take 1 tablet by mouth 3 (three) times daily. 90 tablet 12  . clopidogrel (PLAVIX) 75 MG tablet TAKE 1 TABLET BY MOUTH EVERY DAY 90 tablet 3  . [facility]S ZINC 50 MG tablet TAKE 1 TABLET BY MOUTH EVERY DAY 100 tablet 0  . diclofenac (VOLTAREN) 75 MG EC tablet  Take 1 tablet (75 mg total) by mouth 2 (two) times daily. 60 tablet 6  . diclofenac sodium (VOLTAREN) 1 % GEL APPLY 2 GRAMS TO AFFECTED AREA 4 TIMES A DAY 100 g 5  . diphenhydrAMINE (BENADRYL) 50 MG tablet Take 1 tablet (50 mg total) by mouth at bedtime as needed for itching. 1 tablet 0  . docusate sodium (COLACE) 100 MG capsule Take 1 capsule (100 mg total) by mouth every 12 (twelve) hours. 60 capsule 0  . ezetimibe (ZETIA) 10 MG tablet Take 1 tablet (10 mg total) by mouth daily. 90 tablet 3  . folic acid (FOLVITE) 1 MG tablet TAKE 1 TABLET BY MOUTH EVERY DAY 30 tablet 5  . furosemide (LASIX) 40 MG tablet TAKE 2 TABLETS BY MOUTH EVERY MORNING AND 1 TABLETS IN THE EVENING 270 tablet 3  . methocarbamol (ROBAXIN) 750 MG tablet Take 1 tablet (750 mg total) by mouth 2 (two) times daily as needed for muscle spasms. 60 tablet 0  . metoprolol tartrate (LOPRESSOR) 25 MG tablet TAKE 1 TABLET BY MOUTH TWICE A DAY 180 tablet 3  . nitroGLYCERIN (NITROSTAT) 0.4 MG SL tablet Place 1 tablet (0.4 mg total) under the tongue every 5 (five) minutes as needed (chest pain). 25 tablet 3  . pantoprazole (PROTONIX) 40 MG tablet TAKE 1 TABLET BY MOUTH EVERY DAY  90 tablet 3  . ramipril (ALTACE) 2.5 MG capsule TAKE 1 CAPSULE (2.5 MG TOTAL) BY MOUTH DAILY. 90 capsule 3  . rosuvastatin (CRESTOR) 40 MG tablet TAKE 1 TABLET BY MOUTH EVERY DAY 90 tablet 3  . isosorbide mononitrate (IMDUR) 60 MG 24 hr tablet Take 1 tablet (60 mg total) by mouth daily. 30 tablet 6   No current facility-administered medications for this visit.     Allergies  Allergen Reactions  . Iodine Swelling and Rash    SWELLING REACTION UNSPECIFIED       Past Medical History:  Diagnosis Date  . CAD (coronary artery disease)    cypher stent  . GERD (gastroesophageal reflux disease)   . Headache   . Heart attack (HCC)    x 2  . HLD (hyperlipidemia)   . HTN (hypertension)   . Humerus fracture   . Obesity   . Tinnitus     Social History    Socioeconomic History  . Marital status: Married    Spouse name: Not on file  . Number of children: Not on file  . Years of education: Not on file  . Highest education level: Not on file  Occupational History  . Occupation: mortgage loan officer  Social Needs  . Financial resource strain: Not on file  . Food insecurity:    Worry: Not on file    Inability: Not on file  . Transportation needs:    Medical: Not on file    Non-medical: Not on file  Tobacco Use  . Smoking status: Former Smoker    Packs/day: 2.00    Types: Cigarettes  . Smokeless tobacco: Never Used  . Tobacco comment: 2 ppd since age 17  Substance and Sexual Activity  . Alcohol use: Not Currently    Comment: occ  . Drug use: No  . Sexual activity: Not on file  Lifestyle  . Physical activity:    Days per week: Not on file    Minutes per session: Not on file  . Stress: Not on file  Relationships  . Social connections:    Talks on phone: Not on file    Gets together: Not on file    Attends religious service: Not on file    Active member of club or organization: Not on file    Attends meetings of clubs or organizations: Not on file    Relationship status: Not on file  . Intimate partner violence:    Fear of current or ex partner: Not on file    Emotionally abused: Not on file    Physically abused: Not on file    Forced sexual activity: Not on file  Other Topics Concern  . Not on file  Social History Narrative  . Not on file     Family History  Problem Relation Age of Onset  . Heart attack Father 30       died at age 41  . Heart attack Mother   . Aortic aneurysm Mother        died of complication after surgery     Review of Systems: General: negative for chills, fever, night sweats or weight changes.  Cardiovascular: negative for dyspnea on exertion, edema, orthopnea, palpitations, paroxysmal nocturnal dyspnea or shortness of breath Dermatological: negative for rash Respiratory: negative for  cough or wheezing Urologic: negative for hematuria Abdominal: negative for nausea, vomiting, diarrhea, bright red blood per rectum, melena, or hematemesis Neurologic: negative for visual changes, syncope, or dizziness All other   systems reviewed and are otherwise negative except as noted above.    Blood pressure 120/64, pulse 65, height 5\' 3"  (1.6 m), weight 251 lb 12.8 oz (114.2 kg).  General appearance: alert, cooperative, no distress and moderately obese Neck: no carotid bruit and no JVD Lungs: clear to auscultation bilaterally Heart: regular rate and rhythm Abdomen: obese Extremities: no LE edema Pulses: 2+ Rt RA pulse Skin: Skin color, texture, turgor normal. No rashes or lesions Neurologic: Grossly normal  EKG NSR  ASSESSMENT AND PLAN:   Chest pain with moderate risk of acute coronary syndrome Her symptoms are concerning enough to warrant cath- initially she was reluctant but when I explained it would be done radially she is agreeable.   CAD S/P percutaneous coronary angioplasty H/O remote CFX PCI Cath 2006- ISR CFX and LAD disease- both sites Rx'd with DES.  Essential hypertension Controlled  Dyslipidemia, goal LDL below 70 Followed by Lipid clinic  Morbid obesity (HCC) BMI 44   PLAN  Increase Imdur to 60 mg.  Plan cath as an OP. Her husband was present during this interview and exam.  The patient understands that risks included but are not limited to stroke (1 in 1000), death (1 in 1000), kidney failure [usually temporary] (1 in 500), bleeding (1 in 200), allergic reaction [possibly serious] (1 in 200).  The patient understands and agrees to proceed.   Corine ShelterLuke Alex Mcmanigal PA-C 10/26/2018 4:50 PM

## 2018-10-27 LAB — BASIC METABOLIC PANEL
BUN/Creatinine Ratio: 17 (ref 9–23)
BUN: 13 mg/dL (ref 6–24)
CO2: 22 mmol/L (ref 20–29)
Calcium: 9.7 mg/dL (ref 8.7–10.2)
Chloride: 102 mmol/L (ref 96–106)
Creatinine, Ser: 0.76 mg/dL (ref 0.57–1.00)
GFR calc Af Amer: 101 mL/min/{1.73_m2} (ref >59)
GFR calc non Af Amer: 88 mL/min/{1.73_m2} (ref >59)
Glucose: 78 mg/dL (ref 65–99)
Potassium: 4.1 mmol/L (ref 3.5–5.2)
Sodium: 139 mmol/L (ref 134–144)

## 2018-10-27 LAB — CBC
Hematocrit: 45.5 % (ref 34.0–46.6)
Hemoglobin: 15.6 g/dL (ref 11.1–15.9)
MCH: 33.5 pg — ABNORMAL HIGH (ref 26.6–33.0)
MCHC: 34.3 g/dL (ref 31.5–35.7)
MCV: 98 fL — ABNORMAL HIGH (ref 79–97)
Platelets: 241 10*3/uL (ref 150–450)
RBC: 4.65 x10E6/uL (ref 3.77–5.28)
RDW: 12.1 % (ref 11.7–15.4)
WBC: 7 10*3/uL (ref 3.4–10.8)

## 2018-10-30 ENCOUNTER — Telehealth: Payer: Self-pay | Admitting: *Deleted

## 2018-10-30 NOTE — Telephone Encounter (Addendum)
Pt contacted pre-catheterization scheduled at [facility] for: Tuesday October 31, 2018 3 PM Verified arrival time and place: [facility] Main Entrance A at: 1 PM  No solid food after midnight prior to cath, clear liquids until 5 AM day of procedure. Contrast allergy-no  Hold: Furosemide-AM of procedure.  Except hold medications AM meds can be  taken pre-cath with sip of water including: ASA 81 mg  Clopidogrel 75 mg  Confirm patient has responsible person to drive home post procedure and observe 24 hours after arriving home: yes  I reviewed instructions with patient, she verbalized understanding, thanked me for call. I encouraged patient to increase her water intake today to hydrate pre procedure.

## 2018-10-31 ENCOUNTER — Other Ambulatory Visit: Payer: Self-pay

## 2018-10-31 ENCOUNTER — Ambulatory Visit (HOSPITAL_COMMUNITY)
Admission: RE | Admit: 2018-10-31 | Discharge: 2018-11-01 | Disposition: A | Discharge status: Home / Self Care | Payer: 59 | Attending: Cardiology | Admitting: Cardiology

## 2018-10-31 ENCOUNTER — Encounter (HOSPITAL_COMMUNITY)
Admission: RE | Disposition: A | Discharge status: Home / Self Care | Payer: Self-pay | Source: Home / Self Care | Attending: Cardiology

## 2018-10-31 DIAGNOSIS — E785 Hyperlipidemia, unspecified: Secondary | ICD-10-CM | POA: Diagnosis not present

## 2018-10-31 DIAGNOSIS — I10 Essential (primary) hypertension: Secondary | ICD-10-CM | POA: Diagnosis not present

## 2018-10-31 DIAGNOSIS — I25110 Atherosclerotic heart disease of native coronary artery with unstable angina pectoris: Secondary | ICD-10-CM | POA: Insufficient documentation

## 2018-10-31 DIAGNOSIS — Z955 Presence of coronary angioplasty implant and graft: Secondary | ICD-10-CM

## 2018-10-31 DIAGNOSIS — Z7902 Long term (current) use of antithrombotics/antiplatelets: Secondary | ICD-10-CM | POA: Insufficient documentation

## 2018-10-31 DIAGNOSIS — Z8249 Family history of ischemic heart disease and other diseases of the circulatory system: Secondary | ICD-10-CM | POA: Diagnosis not present

## 2018-10-31 DIAGNOSIS — Z6841 Body Mass Index (BMI) 40.0 and over, adult: Secondary | ICD-10-CM | POA: Diagnosis not present

## 2018-10-31 DIAGNOSIS — Z888 Allergy status to other drugs, medicaments and biological substances status: Secondary | ICD-10-CM | POA: Diagnosis not present

## 2018-10-31 DIAGNOSIS — I200 Unstable angina: Secondary | ICD-10-CM | POA: Diagnosis present

## 2018-10-31 DIAGNOSIS — Z9861 Coronary angioplasty status: Secondary | ICD-10-CM

## 2018-10-31 DIAGNOSIS — I252 Old myocardial infarction: Secondary | ICD-10-CM | POA: Diagnosis not present

## 2018-10-31 DIAGNOSIS — Z87891 Personal history of nicotine dependence: Secondary | ICD-10-CM | POA: Diagnosis not present

## 2018-10-31 DIAGNOSIS — K219 Gastro-esophageal reflux disease without esophagitis: Secondary | ICD-10-CM | POA: Diagnosis not present

## 2018-10-31 DIAGNOSIS — Z79899 Other long term (current) drug therapy: Secondary | ICD-10-CM | POA: Insufficient documentation

## 2018-10-31 DIAGNOSIS — I2510 Atherosclerotic heart disease of native coronary artery without angina pectoris: Secondary | ICD-10-CM

## 2018-10-31 HISTORY — PX: CORONARY STENT INTERVENTION: CATH118234

## 2018-10-31 HISTORY — PX: LEFT HEART CATH AND CORONARY ANGIOGRAPHY: CATH118249

## 2018-10-31 LAB — POCT ACTIVATED CLOTTING TIME: Activated Clotting Time: 367 seconds

## 2018-10-31 SURGERY — LEFT HEART CATH AND CORONARY ANGIOGRAPHY
Anesthesia: LOCAL

## 2018-10-31 MED ORDER — VERAPAMIL HCL 2.5 MG/ML IV SOLN
INTRAVENOUS | Status: DC | PRN
  Administered 2018-10-31: 10 mL via INTRA_ARTERIAL

## 2018-10-31 MED ORDER — HEPARIN (PORCINE) IN NACL 1000-0.9 UT/500ML-% IV SOLN
INTRAVENOUS | Status: DC | PRN
  Administered 2018-10-31 (×2): 500 mL

## 2018-10-31 MED ORDER — FAMOTIDINE IN NACL 20-0.9 MG/50ML-% IV SOLN
INTRAVENOUS | Status: AC
  Filled 2018-10-31: qty 50

## 2018-10-31 MED ORDER — ONDANSETRON HCL 4 MG/2ML IJ SOLN: 4.0000 mg | Freq: Four times a day (QID) | INTRAMUSCULAR | Status: DC | PRN

## 2018-10-31 MED ORDER — SODIUM CHLORIDE 0.9 % IV SOLN: 250.0000 mL | INTRAVENOUS | Status: DC | PRN

## 2018-10-31 MED ORDER — FENTANYL CITRATE (PF) 100 MCG/2ML IJ SOLN
INTRAMUSCULAR | Status: AC
  Filled 2018-10-31: qty 2

## 2018-10-31 MED ORDER — ACETAMINOPHEN 325 MG PO TABS: 650.0000 mg | ORAL_TABLET | ORAL | Status: DC | PRN

## 2018-10-31 MED ORDER — NITROGLYCERIN 0.4 MG SL SUBL: 0.4000 mg | SUBLINGUAL_TABLET | SUBLINGUAL | Status: DC | PRN

## 2018-10-31 MED ORDER — HEPARIN SODIUM (PORCINE) 1000 UNIT/ML IJ SOLN
INTRAMUSCULAR | Status: AC
  Filled 2018-10-31: qty 1

## 2018-10-31 MED ORDER — CLOPIDOGREL BISULFATE 300 MG PO TABS
ORAL_TABLET | ORAL | Status: DC | PRN
  Administered 2018-10-31: 300 mg via ORAL

## 2018-10-31 MED ORDER — CLOPIDOGREL BISULFATE 75 MG PO TABS
75.0000 mg | ORAL_TABLET | Freq: Every day | ORAL | Status: DC
  Administered 2018-11-01: 09:00:00 75 mg via ORAL
  Filled 2018-10-31: qty 1

## 2018-10-31 MED ORDER — SODIUM CHLORIDE 0.9 % WEIGHT BASED INFUSION: 1.0000 mL/kg/h | INTRAVENOUS | Status: DC

## 2018-10-31 MED ORDER — PREDNISONE 50 MG PO TABS
60.0000 mg | ORAL_TABLET | Freq: Once | ORAL | Status: AC
  Administered 2018-10-31: 60 mg via ORAL
  Filled 2018-10-31: qty 1

## 2018-10-31 MED ORDER — FUROSEMIDE 40 MG PO TABS
80.0000 mg | ORAL_TABLET | Freq: Every day | ORAL | Status: DC
  Filled 2018-10-31: qty 2

## 2018-10-31 MED ORDER — FAMOTIDINE IN NACL 20-0.9 MG/50ML-% IV SOLN
INTRAVENOUS | Status: AC | PRN
  Administered 2018-10-31: 20 mg via INTRAVENOUS

## 2018-10-31 MED ORDER — DIPHENHYDRAMINE HCL 50 MG/ML IJ SOLN
25.0000 mg | Freq: Once | INTRAMUSCULAR | Status: AC
  Administered 2018-10-31: 25 mg via INTRAVENOUS

## 2018-10-31 MED ORDER — MIDAZOLAM HCL 2 MG/2ML IJ SOLN
INTRAMUSCULAR | Status: AC
  Filled 2018-10-31: qty 2

## 2018-10-31 MED ORDER — PANTOPRAZOLE SODIUM 40 MG PO TBEC
40.0000 mg | DELAYED_RELEASE_TABLET | Freq: Every day | ORAL | Status: DC
  Administered 2018-11-01: 40 mg via ORAL
  Filled 2018-10-31: qty 1

## 2018-10-31 MED ORDER — RAMIPRIL 2.5 MG PO CAPS
2.5000 mg | ORAL_CAPSULE | Freq: Every day | ORAL | Status: DC
  Administered 2018-11-01: 09:00:00 2.5 mg via ORAL
  Filled 2018-10-31: qty 1

## 2018-10-31 MED ORDER — NITROGLYCERIN 1 MG/10 ML FOR IR/CATH LAB
INTRA_ARTERIAL | Status: DC | PRN
  Administered 2018-10-31: 300 ug via INTRACORONARY
  Administered 2018-10-31: 200 ug via INTRACORONARY
  Administered 2018-10-31: 200 ug via INTRA_ARTERIAL

## 2018-10-31 MED ORDER — SODIUM CHLORIDE 0.9% FLUSH: 3.0000 mL | Freq: Two times a day (BID) | INTRAVENOUS | Status: DC

## 2018-10-31 MED ORDER — SODIUM CHLORIDE 0.9% FLUSH: 3.0000 mL | INTRAVENOUS | Status: DC | PRN

## 2018-10-31 MED ORDER — FENTANYL CITRATE (PF) 100 MCG/2ML IJ SOLN
INTRAMUSCULAR | Status: DC | PRN
  Administered 2018-10-31 (×4): 25 ug via INTRAVENOUS

## 2018-10-31 MED ORDER — ASPIRIN 81 MG PO CHEW: 81.0000 mg | CHEWABLE_TABLET | ORAL | Status: DC

## 2018-10-31 MED ORDER — ASPIRIN 81 MG PO CHEW: 81.0000 mg | CHEWABLE_TABLET | Freq: Once | ORAL | Status: AC

## 2018-10-31 MED ORDER — LIDOCAINE HCL (PF) 1 % IJ SOLN
INTRAMUSCULAR | Status: DC | PRN
  Administered 2018-10-31: 2 mL via INTRADERMAL

## 2018-10-31 MED ORDER — NITROGLYCERIN 1 MG/10 ML FOR IR/CATH LAB
INTRA_ARTERIAL | Status: AC
  Filled 2018-10-31: qty 10

## 2018-10-31 MED ORDER — SODIUM CHLORIDE 0.9 % IV SOLN: INTRAVENOUS | Status: AC

## 2018-10-31 MED ORDER — METHYLPREDNISOLONE SODIUM SUCC 125 MG IJ SOLR
125.0000 mg | Freq: Once | INTRAMUSCULAR | Status: AC
  Administered 2018-10-31: 125 mg via INTRAVENOUS

## 2018-10-31 MED ORDER — ROSUVASTATIN CALCIUM 20 MG PO TABS
40.0000 mg | ORAL_TABLET | Freq: Every day | ORAL | Status: DC
  Administered 2018-11-01: 09:00:00 40 mg via ORAL
  Filled 2018-10-31: qty 2

## 2018-10-31 MED ORDER — MORPHINE SULFATE (PF) 10 MG/ML IV SOLN
INTRAVENOUS | Status: DC | PRN
  Administered 2018-10-31: 2 mg via INTRAVENOUS

## 2018-10-31 MED ORDER — DIPHENHYDRAMINE HCL 50 MG/ML IJ SOLN
INTRAMUSCULAR | Status: AC
  Administered 2018-10-31: 25 mg via INTRAVENOUS
  Filled 2018-10-31: qty 1

## 2018-10-31 MED ORDER — SODIUM CHLORIDE 0.9 % WEIGHT BASED INFUSION
3.0000 mL/kg/h | INTRAVENOUS | Status: DC
  Administered 2018-10-31: 3 mL/kg/h via INTRAVENOUS

## 2018-10-31 MED ORDER — HEPARIN (PORCINE) IN NACL 1000-0.9 UT/500ML-% IV SOLN
INTRAVENOUS | Status: AC
  Filled 2018-10-31: qty 1000

## 2018-10-31 MED ORDER — VERAPAMIL HCL 2.5 MG/ML IV SOLN
INTRAVENOUS | Status: AC
  Filled 2018-10-31: qty 2

## 2018-10-31 MED ORDER — MORPHINE SULFATE (PF) 10 MG/ML IV SOLN
INTRAVENOUS | Status: AC
  Filled 2018-10-31: qty 1

## 2018-10-31 MED ORDER — EZETIMIBE 10 MG PO TABS
10.0000 mg | ORAL_TABLET | Freq: Every day | ORAL | Status: DC
  Administered 2018-11-01: 10 mg via ORAL
  Filled 2018-10-31: qty 1

## 2018-10-31 MED ORDER — ISOSORBIDE MONONITRATE ER 60 MG PO TB24
60.0000 mg | ORAL_TABLET | Freq: Every day | ORAL | Status: DC
  Administered 2018-11-01: 60 mg via ORAL
  Filled 2018-10-31: qty 1

## 2018-10-31 MED ORDER — HYDRALAZINE HCL 20 MG/ML IJ SOLN: 5.0000 mg | INTRAMUSCULAR | Status: AC | PRN

## 2018-10-31 MED ORDER — LIDOCAINE HCL (PF) 1 % IJ SOLN
INTRAMUSCULAR | Status: AC
  Filled 2018-10-31: qty 30

## 2018-10-31 MED ORDER — DIPHENHYDRAMINE HCL 25 MG PO CAPS
50.0000 mg | ORAL_CAPSULE | Freq: Every evening | ORAL | Status: DC | PRN
  Filled 2018-10-31: qty 2

## 2018-10-31 MED ORDER — HEPARIN SODIUM (PORCINE) 1000 UNIT/ML IJ SOLN
INTRAMUSCULAR | Status: DC | PRN
  Administered 2018-10-31 (×2): 6500 [IU] via INTRAVENOUS

## 2018-10-31 MED ORDER — METOPROLOL TARTRATE 25 MG PO TABS
25.0000 mg | ORAL_TABLET | Freq: Two times a day (BID) | ORAL | Status: DC
  Administered 2018-10-31 – 2018-11-01 (×2): 25 mg via ORAL
  Filled 2018-10-31 (×2): qty 1

## 2018-10-31 MED ORDER — METHYLPREDNISOLONE SODIUM SUCC 125 MG IJ SOLR
INTRAMUSCULAR | Status: AC
  Administered 2018-10-31: 125 mg via INTRAVENOUS
  Filled 2018-10-31: qty 2

## 2018-10-31 MED ORDER — ANGIOPLASTY BOOK
Freq: Once | Status: AC
  Administered 2018-10-31: 22:00:00 1
  Filled 2018-10-31: qty 1

## 2018-10-31 MED ORDER — IOHEXOL 350 MG/ML SOLN
INTRAVENOUS | Status: DC | PRN
  Administered 2018-10-31: 140 mL via INTRAVENOUS

## 2018-10-31 MED ORDER — MIDAZOLAM HCL 2 MG/2ML IJ SOLN
INTRAMUSCULAR | Status: DC | PRN
  Administered 2018-10-31 (×2): 1 mg via INTRAVENOUS

## 2018-10-31 MED ORDER — CLOPIDOGREL BISULFATE 300 MG PO TABS
ORAL_TABLET | ORAL | Status: AC
  Filled 2018-10-31: qty 1

## 2018-10-31 MED ORDER — FUROSEMIDE 40 MG PO TABS
40.0000 mg | ORAL_TABLET | Freq: Every day | ORAL | Status: DC
  Administered 2018-10-31: 18:00:00 40 mg via ORAL
  Filled 2018-10-31: qty 1

## 2018-10-31 MED ORDER — LABETALOL HCL 5 MG/ML IV SOLN: 10.0000 mg | INTRAVENOUS | Status: AC | PRN

## 2018-10-31 SURGICAL SUPPLY — 17 items
BALLN SAPPHIRE 2.5X15 (BALLOONS) ×2
BALLN SAPPHIRE ~~LOC~~ 3.0X12 (BALLOONS) ×1 IMPLANT
BALLOON SAPPHIRE 2.5X15 (BALLOONS) IMPLANT
CATH OPTITORQUE TIG 4.0 5F (CATHETERS) ×1 IMPLANT
CATH VISTA GUIDE 6FR XB3.5 (CATHETERS) ×1 IMPLANT
DEVICE RAD COMP TR BAND LRG (VASCULAR PRODUCTS) ×1 IMPLANT
GLIDESHEATH SLEND A-KIT 6F 22G (SHEATH) ×1 IMPLANT
GUIDEWIRE INQWIRE 1.5J.035X260 (WIRE) IMPLANT
INQWIRE 1.5J .035X260CM (WIRE) ×2
KIT ENCORE 26 ADVANTAGE (KITS) ×1 IMPLANT
KIT HEART LEFT (KITS) ×2 IMPLANT
PACK CARDIAC CATHETERIZATION (CUSTOM PROCEDURE TRAY) ×2 IMPLANT
SHEATH PROBE COVER 6X72 (BAG) ×1 IMPLANT
STENT RESOLUTE ONYX 2.75X18 (Permanent Stent) ×1 IMPLANT
TRANSDUCER W/STOPCOCK (MISCELLANEOUS) ×2 IMPLANT
TUBING CIL FLEX 10 FLL-RA (TUBING) ×2 IMPLANT
WIRE SION BLUE 180 (WIRE) ×1 IMPLANT

## 2018-10-31 NOTE — Progress Notes (Signed)
Pt unsure of an allergy 25 yrs ago, thinks it was iodine but unsure what the doctor gave her. Sherlynn Stalls neal rn notified, will update Dr Herbie Baltimore and call back if additional orders.

## 2018-10-31 NOTE — Progress Notes (Signed)
TR BAND REMOVAL  LOCATION:    right radial  DEFLATED PER PROTOCOL:    Yes.    TIME BAND OFF / DRESSING APPLIED:    20:45   SITE UPON ARRIVAL:    Level 0  SITE AFTER BAND REMOVAL:    Level 0  CIRCULATION SENSATION AND MOVEMENT:    Within Normal Limits   Yes.    COMMENTS:   Post TR band instructions given. Pt tolerated well. 

## 2018-10-31 NOTE — Interval H&P Note (Signed)
History and Physical Interval Note:  10/31/2018 2:28 PM  Shery K Filar  has presented today for surgery, with the diagnosis of CP-progressive angina the various methods of treatment have been discussed with the patient and family. After consideration of risks, benefits and other options for treatment, the patient has consented to  Procedure(s): LEFT HEART CATH AND CORONARY ANGIOGRAPHY (N/A) with possible PERCUTANEOUS CORONARY INTERVENTION as a surgical intervention .  The patient's history has been reviewed, patient examined, no change in status, stable for surgery.  I have reviewed the patient's chart and labs.  Questions were answered to the patient's satisfaction.    Cath Lab Visit (complete for each Cath Lab visit)  Clinical Evaluation Leading to the Procedure:   ACS: No.  Non-ACS:    Anginal Classification: CCS III  Anti-ischemic medical therapy: Maximal Therapy (2 or more classes of medications)  Non-Invasive Test Results: Equivocal test results  Prior CABG: No previous CABG    Bryan Lemma

## 2018-11-01 ENCOUNTER — Ambulatory Visit: Payer: Self-pay | Admitting: Cardiology

## 2018-11-01 ENCOUNTER — Other Ambulatory Visit: Payer: Self-pay

## 2018-11-01 ENCOUNTER — Encounter (HOSPITAL_COMMUNITY): Payer: Self-pay | Admitting: Cardiology

## 2018-11-01 DIAGNOSIS — I2510 Atherosclerotic heart disease of native coronary artery without angina pectoris: Secondary | ICD-10-CM | POA: Diagnosis not present

## 2018-11-01 DIAGNOSIS — I200 Unstable angina: Secondary | ICD-10-CM | POA: Diagnosis not present

## 2018-11-01 DIAGNOSIS — E785 Hyperlipidemia, unspecified: Secondary | ICD-10-CM

## 2018-11-01 DIAGNOSIS — K224 Dyskinesia of esophagus: Secondary | ICD-10-CM

## 2018-11-01 DIAGNOSIS — Z9861 Coronary angioplasty status: Secondary | ICD-10-CM

## 2018-11-01 DIAGNOSIS — I10 Essential (primary) hypertension: Secondary | ICD-10-CM

## 2018-11-01 DIAGNOSIS — I25110 Atherosclerotic heart disease of native coronary artery with unstable angina pectoris: Secondary | ICD-10-CM | POA: Diagnosis not present

## 2018-11-01 LAB — CBC
HCT: 39.5 % (ref 36.0–46.0)
Hemoglobin: 13.9 g/dL (ref 12.0–15.0)
MCH: 34 pg (ref 26.0–34.0)
MCHC: 35.2 g/dL (ref 30.0–36.0)
MCV: 96.6 fL (ref 80.0–100.0)
NRBC: 0 % (ref 0.0–0.2)
PLATELETS: 198 10*3/uL (ref 150–400)
RBC: 4.09 MIL/uL (ref 3.87–5.11)
RDW: 11.7 % (ref 11.5–15.5)
WBC: 9 10*3/uL (ref 4.0–10.5)

## 2018-11-01 LAB — BASIC METABOLIC PANEL
Anion gap: 11 (ref 5–15)
BUN: 11 mg/dL (ref 6–20)
CO2: 23 mmol/L (ref 22–32)
Calcium: 9.3 mg/dL (ref 8.9–10.3)
Chloride: 105 mmol/L (ref 98–111)
Creatinine, Ser: 0.85 mg/dL (ref 0.44–1.00)
GFR calc Af Amer: >60 mL/min (ref >60)
GFR calc non Af Amer: >60 mL/min (ref >60)
Glucose, Bld: 171 mg/dL — ABNORMAL HIGH (ref 70–99)
Potassium: 4 mmol/L (ref 3.5–5.1)
Sodium: 139 mmol/L (ref 135–145)

## 2018-11-01 MED ORDER — ASPIRIN 81 MG PO TBEC: 81.0000 mg | DELAYED_RELEASE_TABLET | Freq: Every day | ORAL | Status: AC

## 2018-11-01 MED ORDER — ASPIRIN EC 81 MG PO TBEC
81.0000 mg | DELAYED_RELEASE_TABLET | Freq: Every day | ORAL | Status: DC
  Administered 2018-11-01: 81 mg via ORAL
  Filled 2018-11-01: qty 1

## 2018-11-01 NOTE — Discharge Summary (Addendum)
Discharge Summary    Patient ID: BRELYN BONITO,  MRN: [id number], DOB/AGE: April 23, 1961 58 y.o.  Admit date: 10/31/2018 Discharge date: 11/01/2018  Primary Care Provider: Gordy Savers Primary Cardiologist: Dr. Antoine Poche   Discharge Diagnoses    Principal Problem:   Progressive angina [facility]) Active Problems:   Dyslipidemia, goal LDL below 70   Essential hypertension   CAD S/P percutaneous coronary angioplasty   Coronary artery disease involving native coronary artery of native heart with unstable angina pectoris (HCC)   Allergies Allergies  Allergen Reactions  . Iodine Swelling and Rash    SWELLING REACTION UNSPECIFIED       Diagnostic Studies/Procedures    Cath: 10/31/2018   CULPRIT LESION: Prox Cx lesion is 85% stenosed.  A drug-eluting stent was successfully placed using a STENT RESOLUTE ONYX 2.75X18. -Postdilated to 3.1 mm  Post intervention, there is a 0% residual stenosis.  ---------------------------------------  Prox Cx to Mid Cx Cypher DES on top of BMS 5% stenosed.  Dist Cx lesion is 50% stenosed beyond prior stent.  Prox LAD-1 Cypher DES is 20% stenosed. Prox LAD-2 lesion is 30% stenosed beyond stent.  Ost 1st Diag lesion is 70% stenosed. Ost 2nd Diag lesion is 40% stenosed.  Prox RCA-1 lesion is 30% stenosed.  Non-stenotic (Aneurysmal) Prox RCA-2 lesion.  ---------------------------------------  The left ventricular systolic function is normal. The left ventricular ejection fraction is 55-65% by visual estimate. LV end diastolic pressure is moderately elevated.   SUMMARY  Multivessel CAD with culprit lesion being 85% proximal circumflex prior to AVG-dCX(OM) bifurcation with widely patent AV groove circumflex and proximal LAD (minimal in-stent restenosis@ 1st & 2nd Diag branches with stable ostial disease of both jailed diagonal branches).  Successful proximal circumflex DES PCI using resolute Onyx DES 2.75 mm x 18 mm (3.1  mm)  Previously noted proximal RCA 40% stenosis is now a focal berry aneurysm contained.  Normal LVEF with no regional wall motion abnormalities and moderately elevated LVEDP.   RECOMMENDATIONS  Monitor overnight.  Discharge home in the morning  Give 1 more dose of oral prednisone for contrast hypersensitivity  Continue aggressive risk factor modification -continue current cardiac meds, titrate as necessary in the outpatient setting.  Follow-Up with Dr. Antoine Poche or Team APP.  Bryan Lemma, MD _____________   History of Present Illness     58 year old female followed by Dr. Antoine Poche with a history of coronary disease since her 30s.  She had her first heart attack at the age of 69 and it was treated with a stent to her left circumflex. In July 2006, she underwent her most recent catheterization for recurrent angina. This showed in-stent restenosis of the circumflex as well as 80% lesion in the LAD and first diagonal. She underwent Cypher drug-eluting stent for in-stent restenosis of the left circumflex as well as to the LAD. Dr Antoine Poche saw her this past summer and she complained of chest pain and jaw pain. Coronary CT was done July 2019.  However, because of previous stenting and her body habitus this was not adequate to exclude obstructive coronary disease.  Dr Antoine Poche saw her in follow up to discuss catheterization but her symptoms had improved.   She presented the office with Franky Macho on 2/6 for a routine follow up.  Initially she said she was fine, then talked about an episode of jaw pain and chest pain two day prior that made her "sweat" it was so bad. She took a NTG with relief.  She  admitted she hadn't taken NTG in > 10 years. The next day she had another episode while at rest in the morning.  "I though I was having a heart attack". Again her symptoms subsided with SL NTG. She admits to some stress at home, her father in law is at home on Hospice. Given her symptoms she was sent for  outpatient cath.   Hospital Course     Underwent cardiac cath noted above with patent LAD, and LCx stent. New stenosis in the pLCx with successful PCI/DES x1. Continue DAPT with ASA/plavix. Morning labs were stable. No chest pain overnight, but did report several episodes of jaw tightness that radiated up into her head while at rest. She was able to walk with cardiac rehab without chest pain. Also reports difficulty swallowing at times. Under a lot of stress. Recommended that she follow up with GI as an outpatient for further work up of symptoms. Of note she uses Voltaren for an arm injury. Advised of the increased risk of bleeding with taking this along with DAPT. She voiced understanding.   General: Well developed, well nourished, female appearing in no acute distress. Head: Normocephalic, atraumatic.  Neck: Supple without bruits, JVD. Lungs:  Resp regular and unlabored, CTA. Heart: RRR, S1, S2, no S3, S4, 1/6 systolic murmur; no rub. Abdomen: Soft, non-tender, non-distended with normoactive bowel sounds. No hepatomegaly. No rebound/guarding. No obvious abdominal masses. Extremities: No clubbing, cyanosis, edema. Distal pedal pulses are 2+ bilaterally. R radial cath site stable without bruising or hematoma Neuro: Alert and oriented X 3. Moves all extremities spontaneously. Psych: Normal affect.  Lilybelle K Alan RipperHolloway was seen by Dr. Eldridge DaceVaranasi and determined stable for discharge home. Follow up in the office has been arranged. Medications are listed below.   _____________  Discharge Vitals Blood pressure 131/72, pulse 70, temperature 98.1 F (36.7 C), temperature source Oral, resp. rate 17, height 5\' 3"  (1.6 m), weight 112 kg, SpO2 94 %.  Filed Weights   10/31/18 1303 11/01/18 0704  Weight: 113.9 kg 112 kg    Labs & Radiologic Studies    CBC Recent Labs    11/01/18 0356  WBC 9.0  HGB 13.9  HCT 39.5  MCV 96.6  PLT 198   Basic Metabolic Panel Recent Labs    16/06/9601/12/20 0356  NA  139  K 4.0  CL 105  CO2 23  GLUCOSE 171*  BUN 11  CREATININE 0.85  CALCIUM 9.3   Liver Function Tests No results for input(s): AST, ALT, ALKPHOS, BILITOT, PROT, ALBUMIN in the last 72 hours. No results for input(s): LIPASE, AMYLASE in the last 72 hours. Cardiac Enzymes No results for input(s): CKTOTAL, CKMB, CKMBINDEX, TROPONINI in the last 72 hours. BNP Invalid input(s): POCBNP D-Dimer No results for input(s): DDIMER in the last 72 hours. Hemoglobin A1C No results for input(s): HGBA1C in the last 72 hours. Fasting Lipid Panel No results for input(s): CHOL, HDL, LDLCALC, TRIG, CHOLHDL, LDLDIRECT in the last 72 hours. Thyroid Function Tests No results for input(s): TSH, T4TOTAL, T3FREE, THYROIDAB in the last 72 hours.  Invalid input(s): FREET3 _____________  No results found. Disposition   Pt is being discharged home today in good condition.  Follow-up Plans & Appointments   Follow-up Information    Abelino DerrickKilroy, Luke K, PA-C Follow up on 11/09/2018.   Specialties:  Cardiology, Radiology Why:  at 11am for your follow up appt Contact information: [address] [place] [address] [phone]  Discharge Instructions    Amb Referral to Cardiac Rehabilitation   Complete by:  As directed    Diagnosis:   Coronary Stents PTCA     Diet - low sodium heart healthy   Complete by:  As directed    Discharge instructions   Complete by:  As directed    Radial Site Care Refer to this sheet in the next few weeks. These instructions provide you with information on caring for yourself after your procedure. Your caregiver may also give you more specific instructions. Your treatment has been planned according to current medical practices, but problems sometimes occur. Call your caregiver if you have any problems or questions after your procedure. HOME CARE INSTRUCTIONS You may shower the day after the procedure.Remove the bandage (dressing) and gently wash the  site with plain soap and water.Gently pat the site dry.  Do not apply powder or lotion to the site.  Do not submerge the affected site in water for 3 to 5 days.  Inspect the site at least twice daily.  Do not flex or bend the affected arm for 24 hours.  No lifting over 5 pounds (2.3 kg) for 5 days after your procedure.  Do not drive home if you are discharged the same day of the procedure. Have someone else drive you.  You may drive 24 hours after the procedure unless otherwise instructed by your caregiver.  What to expect: Any bruising will usually fade within 1 to 2 weeks.  Blood that collects in the tissue (hematoma) may be painful to the touch. It should usually decrease in size and tenderness within 1 to 2 weeks.  SEEK IMMEDIATE MEDICAL CARE IF: You have unusual pain at the radial site.  You have redness, warmth, swelling, or pain at the radial site.  You have drainage (other than a small amount of blood on the dressing).  You have chills.  You have a fever or persistent symptoms for more than 72 hours.  You have a fever and your symptoms suddenly get worse.  Your arm becomes pale, cool, tingly, or numb.  You have heavy bleeding from the site. Hold pressure on the site.   PLEASE DO NOT MISS ANY DOSES OF YOUR PLAVIX!!!!! Also keep a log of you blood pressures and bring back to your follow up appt. Please call the office with any questions.   Patients taking blood thinners should generally stay away from medicines like ibuprofen, Advil, Motrin, naproxen, and Aleve due to risk of stomach bleeding. You may take Tylenol as directed or talk to your primary doctor about alternatives.   Increase activity slowly   Complete by:  As directed       Discharge Medications     Medication List    STOP taking these medications   aspirin 81 MG chewable tablet Replaced by:  aspirin 81 MG EC tablet     TAKE these medications   aspirin 81 MG EC tablet Take 1 tablet (81 mg total) by mouth  daily. Replaces:  aspirin 81 MG chewable tablet Notes to patient:  Prevents clotting in the stent and heart attack   calcium-vitamin D 500-200 MG-UNIT tablet Commonly known as:  OSCAL WITH D Take 1 tablet by mouth 3 (three) times daily. Notes to patient:  Supplement    clopidogrel 75 MG tablet Commonly known as:  PLAVIX TAKE 1 TABLET BY MOUTH EVERY DAY Notes to patient:  Prevents clotting in the stent and heart attack   diclofenac 75 MG  EC tablet Commonly known as:  VOLTAREN Take 1 tablet (75 mg total) by mouth 2 (two) times daily.   diclofenac sodium 1 % Gel Commonly known as:  VOLTAREN APPLY 2 GRAMS TO AFFECTED AREA 4 TIMES A DAY What changed:  See the new instructions. Notes to patient:  As needed for pain    diphenhydrAMINE 50 MG tablet Commonly known as:  BENADRYL Take 1 tablet (50 mg total) by mouth at bedtime as needed for itching. Notes to patient:  As needed for itching    ezetimibe 10 MG tablet Commonly known as:  ZETIA Take 1 tablet (10 mg total) by mouth daily. Notes to patient:  Lowers cholesterol and triglycerides    folic acid 1 MG tablet Commonly known as:  FOLVITE TAKE 1 TABLET BY MOUTH EVERY DAY Notes to patient:  Supplement    furosemide 40 MG tablet Commonly known as:  LASIX TAKE 2 TABLETS BY MOUTH EVERY MORNING AND 1 TABLETS IN THE EVENING What changed:  See the new instructions. Notes to patient:  Fluid pill    isosorbide mononitrate 60 MG 24 hr tablet Commonly known as:  IMDUR Take 1 tablet (60 mg total) by mouth daily. Notes to patient:  Long acting nitrate (works like nitroglycerin) Lowers blood pressure Increases blood flow to the heart Decreases chest pain    metoprolol tartrate 25 MG tablet Commonly known as:  LOPRESSOR TAKE 1 TABLET BY MOUTH TWICE A DAY Notes to patient:  Decreases work of the heart  Lowers blood pressure and heart rate  Decreases force of contraction of heart muscle   nitroGLYCERIN 0.4 MG SL tablet Commonly  known as:  NITROSTAT Place 1 tablet (0.4 mg total) under the tongue every 5 (five) minutes as needed (chest pain). Notes to patient:  As needed for chest pain Short acting nitrate Increases blood flow to heart Decreases chest pain  Lowers blood pressure   pantoprazole 40 MG tablet Commonly known as:  PROTONIX TAKE 1 TABLET BY MOUTH EVERY DAY Notes to patient:  Treat acid reflux Prevents heartburn   ramipril 2.5 MG capsule Commonly known as:  ALTACE TAKE 1 CAPSULE (2.5 MG TOTAL) BY MOUTH DAILY. Notes to patient:  Lowers blood pressure Decreases work of the heart   rosuvastatin 40 MG tablet Commonly known as:  CRESTOR TAKE 1 TABLET BY MOUTH EVERY DAY Notes to patient:  Lowers cholesterol and triglycerides        Acute coronary syndrome (MI, NSTEMI, STEMI, etc) this admission?: No.     Outstanding Labs/Studies   N/a   Duration of Discharge Encounter   Greater than 30 minutes including physician time.  Signed, Laverda Page NP-C 11/01/2018, 11:17 AM   I have examined the patient and reviewed assessment and plan and discussed with patient.  Agree with above as stated.    GEN: Well nourished, well developed, in no acute distress  HEENT: normal  Neck: no JVD, carotid bruits, or masses Cardiac: RRR; no murmurs, rubs, or gallops,no edema  Respiratory:  clear to auscultation bilaterally, normal work of breathing GI: soft, nontender, nondistended,  MS: no deformity or atrophy ; no right radial hematoma, 2+ right radial pulse Skin: warm and dry, no rash Neuro:  Strength and sensation are intact Psych: euthymic mood, full affect  Continue DAPT and aggressive secondary prevention.   Avoid tobacco.  She has some residual neck pain that is worse without her PPI.  There may be some esophageal spasm.  She will continue taking IMdur to  help.   She walked without any problems this AM.  Discharge today.   Lance Muss

## 2018-11-01 NOTE — Progress Notes (Signed)
CARDIAC REHAB PHASE I   PRE:  Rate/Rhythm: 71 SR    BP: sitting 122/62    SaO2:   MODE:  Ambulation: 450 ft   POST:  Rate/Rhythm: 105 ST    BP: sitting 146/66     SaO2:   Pt had an episode of throat burning, teeth tight, flush face toward end of walk. She has had this numerous times today. Lasts 30 sec to a minute. Sts she has had it at home and her chest and arm were involved, today they are not. Seems to have relief with drinking something at times. VSS.  Discussed ed with pt, including importance of Plavix, risk factor modification, and CRPII. She has many poor habits that she is struggling to change. Highly encouraged to make small goals and big goals. Will refer to G'sO CRPII however she sts she will not be able to do it due to work. Work is her main focus, which does seem to bring her stress. She is not ready to quit smoking. Gave materials and discussion. [phone]   Harriet Masson CES, ACSM 11/01/2018 9:12 AM

## 2018-11-02 ENCOUNTER — Telehealth (HOSPITAL_COMMUNITY): Payer: Self-pay

## 2018-11-02 NOTE — Telephone Encounter (Signed)
Pt insurance is active and benefits verified through [facility]. Co-pay $30.00, DED $750.00/$0.00 met, out of pocket $4,500.00/$198.44 met, co-insurance 0%. No pre-authorization required. Passport, 11/02/2018 @ 10:44AM, REF# [id number]  Will contact patient to see if she is interested in the Cardiac Rehab Program. If interested, patient will need to complete follow up appt. Once completed, patient will be contacted for scheduling upon review by the RN Navigator.

## 2018-11-09 ENCOUNTER — Ambulatory Visit (INDEPENDENT_AMBULATORY_CARE_PROVIDER_SITE_OTHER): Payer: 59 | Admitting: Cardiology

## 2018-11-09 ENCOUNTER — Encounter: Payer: Self-pay | Admitting: Cardiology

## 2018-11-09 ENCOUNTER — Other Ambulatory Visit: Payer: Self-pay

## 2018-11-09 VITALS — BP 132/68 | HR 69 | Ht 63.0 in | Wt 256.0 lb

## 2018-11-09 DIAGNOSIS — I10 Essential (primary) hypertension: Secondary | ICD-10-CM | POA: Diagnosis not present

## 2018-11-09 DIAGNOSIS — F17200 Nicotine dependence, unspecified, uncomplicated: Secondary | ICD-10-CM

## 2018-11-09 DIAGNOSIS — Z9861 Coronary angioplasty status: Secondary | ICD-10-CM

## 2018-11-09 DIAGNOSIS — E785 Hyperlipidemia, unspecified: Secondary | ICD-10-CM | POA: Diagnosis not present

## 2018-11-09 DIAGNOSIS — I2510 Atherosclerotic heart disease of native coronary artery without angina pectoris: Secondary | ICD-10-CM | POA: Diagnosis not present

## 2018-11-09 NOTE — Assessment & Plan Note (Signed)
Controlled.  

## 2018-11-09 NOTE — Assessment & Plan Note (Signed)
BMI 44 

## 2018-11-09 NOTE — Progress Notes (Signed)
11/09/2018 [name]   22-Aug-1961  [id number]  Primary Physician Gordy Savers, MD Primary Cardiologist: Dr Antoine Poche  HPI:  Pleasant 58 year old female followed by Dr. Antoine Poche with a history of coronary disease since her 30s.  She had her first heart attack at the age of 76 and it was treated with a stent to her left circumflex. In July 2006, she underwent her most recent catheterization for recurrent angina. This showed in-stent restenosis of the circumflex as well as 80% lesion in the LAD and first diagonal.  She underwent Cypher DES placement for in-stent restenosis of the left circumflex as well as to the LAD. Dr Antoine Poche saw her this past summer and she complained of chest pain and jaw pain.  Coronary CT was done July 2019.  However, because of previous stenting and her body habitus this was not adequate to exclude obstructive coronary disease.  Dr Antoine Poche saw her in follow up to discuss catheterization but her symptoms had improved.  I saw her in the office 10/26/2018 and she complained of jaw pain and chest pain, one episode made her break out into a sweat. I convinced her to proceed with coronary angiogram. This was done 10/31/2018 and revealed patent LAD and CFX stents with a new pCFX 85% stenosis.  This was treated with PCI/ DES.  Her LVF was 55-65%.   She is in the office today for follow-up.  She says she feels much better.  She has less jaw pain and her breathing is better.  I reviewed her angiogram diagram with her.  I explained that our goal would be to prevent progression of her disease.  That would require aggressive cholesterol therapy and her to quit smoking.  She tells me she is really not ready to quit smoking.  She has been approved for PCSK9 in the past but is afraid to take it.  I tried to reassure her that the side effect profile was low but she came up with several reasons why she did not want to take it and I gave up trying to convince her. I also spent 5  minutes discussing smoking cessation but in the end she just is not ready to try that yet.     Current Outpatient Medications  Medication Sig Dispense Refill  . ezetimibe (ZETIA) 10 MG tablet Take 10 mg by mouth daily.    Marland Kitchen aspirin EC 81 MG EC tablet Take 1 tablet (81 mg total) by mouth daily.    . clopidogrel (PLAVIX) 75 MG tablet TAKE 1 TABLET BY MOUTH EVERY DAY (Patient taking differently: Take 75 mg by mouth daily. ) 90 tablet 3  . diclofenac (VOLTAREN) 75 MG EC tablet Take 1 tablet (75 mg total) by mouth 2 (two) times daily. 60 tablet 6  . diclofenac sodium (VOLTAREN) 1 % GEL APPLY 2 GRAMS TO AFFECTED AREA 4 TIMES A DAY (Patient taking differently: Apply 2 g topically 4 (four) times daily as needed (joint pain). ) 100 g 5  . diphenhydrAMINE (BENADRYL) 50 MG tablet Take 1 tablet (50 mg total) by mouth at bedtime as needed for itching. 1 tablet 0  . furosemide (LASIX) 40 MG tablet TAKE 2 TABLETS BY MOUTH EVERY MORNING AND 1 TABLETS IN THE EVENING (Patient taking differently: Take 40-80 mg by mouth See admin instructions. Take 80mg  in the morning and 40mg  in the evening) 270 tablet 3  . isosorbide mononitrate (IMDUR) 60 MG 24 hr tablet Take 1 tablet (60 mg total) by  mouth daily. 30 tablet 6  . metoprolol tartrate (LOPRESSOR) 25 MG tablet TAKE 1 TABLET BY MOUTH TWICE A DAY (Patient taking differently: Take 25 mg by mouth 2 (two) times daily. ) 180 tablet 3  . nitroGLYCERIN (NITROSTAT) 0.4 MG SL tablet Place 1 tablet (0.4 mg total) under the tongue every 5 (five) minutes as needed (chest pain). 25 tablet 3  . pantoprazole (PROTONIX) 40 MG tablet TAKE 1 TABLET BY MOUTH EVERY DAY (Patient taking differently: Take 40 mg by mouth daily. ) 90 tablet 3  . ramipril (ALTACE) 2.5 MG capsule TAKE 1 CAPSULE (2.5 MG TOTAL) BY MOUTH DAILY. 90 capsule 3  . rosuvastatin (CRESTOR) 40 MG tablet TAKE 1 TABLET BY MOUTH EVERY DAY (Patient taking differently: Take 40 mg by mouth daily. ) 90 tablet 3   No current  facility-administered medications for this visit.     Allergies  Allergen Reactions  . Iodine Swelling and Rash    SWELLING REACTION UNSPECIFIED       Past Medical History:  Diagnosis Date  . CAD (coronary artery disease)    cypher stent  . GERD (gastroesophageal reflux disease)   . Headache   . Heart attack (HCC)    x 2  . HLD (hyperlipidemia)   . HTN (hypertension)   . Humerus fracture   . Obesity   . Tinnitus     Social History   Socioeconomic History  . Marital status: Married    Spouse name: Not on file  . Number of children: Not on file  . Years of education: Not on file  . Highest education level: Not on file  Occupational History  . Occupation: Development worker, communitymortgage loan officer  Social Needs  . Financial resource strain: Not on file  . Food insecurity:    Worry: Not on file    Inability: Not on file  . Transportation needs:    Medical: Not on file    Non-medical: Not on file  Tobacco Use  . Smoking status: Current Some Day Smoker    Packs/day: 1.00    Types: Cigarettes  . Smokeless tobacco: Never Used  . Tobacco comment: 2 ppd since age 58  Substance and Sexual Activity  . Alcohol use: Not Currently    Comment: occ  . Drug use: No  . Sexual activity: Not on file  Lifestyle  . Physical activity:    Days per week: Not on file    Minutes per session: Not on file  . Stress: Not on file  Relationships  . Social connections:    Talks on phone: Not on file    Gets together: Not on file    Attends religious service: Not on file    Active member of club or organization: Not on file    Attends meetings of clubs or organizations: Not on file    Relationship status: Not on file  . Intimate partner violence:    Fear of current or ex partner: Not on file    Emotionally abused: Not on file    Physically abused: Not on file    Forced sexual activity: Not on file  Other Topics Concern  . Not on file  Social History Narrative  . Not on file     Family History    Problem Relation Age of Onset  . Heart attack Father [id number]       died at age 57  . Heart attack Mother   . Aortic aneurysm Mother  died of complication after surgery     Review of Systems: General: negative for chills, fever, night sweats or weight changes.  Cardiovascular: negative for chest pain, dyspnea on exertion, edema, orthopnea, palpitations, paroxysmal nocturnal dyspnea or shortness of breath Dermatological: negative for rash Respiratory: negative for cough or wheezing Urologic: negative for hematuria Abdominal: negative for nausea, vomiting, diarrhea, bright red blood per rectum, melena, or hematemesis Neurologic: negative for visual changes, syncope, or dizziness All other systems reviewed and are otherwise negative except as noted above.    Blood pressure 132/68, pulse 69, height 5\' 3"  (1.6 m), weight 256 lb (116.1 kg), SpO2 99 %.  General appearance: alert, cooperative, no distress and moderately obese Extremities: no edema Skin: warm and dry Neurologic: Grossly normal  EKG NSR-60  ASSESSMENT AND PLAN:   CAD S/P percutaneous coronary angioplasty H/O remote CFX PCI Cath 2006- ISR CFX and LAD disease- both sites Rx'd with DES. Cath for chest pain 10/31/2018- new mCFX 85% stenosis- Resolute DES-prior LAD and dCFX DES patent.  TOBACCO USER Still smoking 1 PPD-unwilling to consider quitting   Essential hypertension Controlled  Dyslipidemia, goal LDL below 70 LDL was 101- she was approved for PCSK9 but didn't want to take it  Morbid obesity (HCC) BMI 44   PLAN  Same Rx- f/u with Dr Antoine Poche in 6 months.   Corine Shelter PA-C 11/09/2018 11:25 AM

## 2018-11-09 NOTE — Assessment & Plan Note (Addendum)
Still smoking 1 PPD-unwilling to consider quitting

## 2018-11-09 NOTE — Patient Instructions (Signed)
Medication Instructions:  Your physician recommends that you continue on your current medications as directed. Please refer to the Current Medication list given to you today.  If you need a refill on your cardiac medications before your next appointment, please call your pharmacy.   Lab work: None  If you have labs (blood work) drawn today and your tests are completely normal, you will receive your results only by: . MyChart Message (if you have MyChart) OR . A paper copy in the mail If you have any lab test that is abnormal or we need to change your treatment, we will call you to review the results.  Testing/Procedures: None   Follow-Up: At [facility], you and your health needs are our priority.  As part of our continuing mission to provide you with exceptional heart care, we have created designated Provider Care Teams.  These Care Teams include your primary Cardiologist (physician) and Advanced Practice Providers (APPs -  Physician Assistants and Nurse Practitioners) who all work together to provide you with the care you need, when you need it. You will need a follow up appointment in 6 months.  Please call our office 2 months in advance to schedule this appointment.  You may see James Hochrein, MD or one of the following Advanced Practice Providers on your designated Care Team:   Rhonda Barrett, PA-C . Kathryn Lawrence, DNP, ANP  Any Other Special Instructions Will Be Listed Below (If Applicable).   

## 2018-11-09 NOTE — Assessment & Plan Note (Signed)
LDL was 101- she was approved for PCSK9 but didn't want to take it

## 2018-11-09 NOTE — Assessment & Plan Note (Addendum)
H/O remote CFX PCI Cath 2006- ISR CFX and LAD disease- both sites Rx'd with DES. Cath for chest pain 10/31/2018- new mCFX 85% stenosis- Resolute DES-prior LAD and dCFX DES patent.

## 2018-11-13 NOTE — Addendum Note (Signed)
Addended by: Evans Lance on: 11/13/2018 04:33 PM   Modules accepted: Orders

## 2018-11-17 NOTE — Telephone Encounter (Signed)
Called patient to see if she was interested in participating in the Cardiac Rehab Program. Patient stated not at this time due to her work schedule, she goes in around 8-830 and does not get off until 8 at night.  Closed referral

## 2018-12-13 ENCOUNTER — Ambulatory Visit: Payer: Self-pay | Admitting: Cardiology

## 2019-01-25 ENCOUNTER — Other Ambulatory Visit: Payer: Self-pay

## 2019-01-25 MED ORDER — EZETIMIBE 10 MG PO TABS: 10.0000 mg | ORAL_TABLET | Freq: Every day | ORAL | 3 refills | Status: DC

## 2019-03-07 ENCOUNTER — Telehealth: Payer: Self-pay | Admitting: *Deleted

## 2019-03-07 NOTE — Telephone Encounter (Signed)
A message was left, re: follow up visit. 

## 2019-03-25 ENCOUNTER — Other Ambulatory Visit (INDEPENDENT_AMBULATORY_CARE_PROVIDER_SITE_OTHER): Payer: Self-pay | Admitting: Orthopaedic Surgery

## 2019-03-26 ENCOUNTER — Other Ambulatory Visit: Payer: Self-pay

## 2019-03-26 NOTE — Telephone Encounter (Signed)
Ok to rf? 

## 2019-04-16 ENCOUNTER — Other Ambulatory Visit: Payer: Self-pay | Admitting: *Deleted

## 2019-04-17 MED ORDER — CLOPIDOGREL BISULFATE 75 MG PO TABS: 75.0000 mg | ORAL_TABLET | Freq: Every day | ORAL | 2 refills | Status: DC

## 2019-05-07 ENCOUNTER — Other Ambulatory Visit: Payer: Self-pay | Admitting: Cardiology

## 2019-07-02 ENCOUNTER — Telehealth: Payer: Self-pay | Admitting: *Deleted

## 2019-07-02 NOTE — Telephone Encounter (Signed)
Unable to leave a message,mailbox is full. 

## 2019-07-31 ENCOUNTER — Other Ambulatory Visit: Payer: Self-pay | Admitting: Cardiology

## 2019-08-08 DIAGNOSIS — E785 Hyperlipidemia, unspecified: Secondary | ICD-10-CM | POA: Insufficient documentation

## 2019-08-08 DIAGNOSIS — I2510 Atherosclerotic heart disease of native coronary artery without angina pectoris: Secondary | ICD-10-CM | POA: Insufficient documentation

## 2019-08-08 NOTE — Progress Notes (Signed)
Virtual Visit via Telephone Note   This visit type was conducted due to national recommendations for restrictions regarding the COVID-19 Pandemic (e.g. social distancing) in an effort to limit this patient's exposure and mitigate transmission in our community.  Due to her co-morbid illnesses, this patient is at least at moderate risk for complications without adequate follow up.  This format is felt to be most appropriate for this patient at this time.  The patient did not have access to video technology/had technical difficulties with video requiring transitioning to audio format only (telephone).  All issues noted in this document were discussed and addressed.  No physical exam could be performed with this format.  Please refer to the patient's chart for her  consent to telehealth for [facility].   Date:  08/09/2019   ID:  [name], DOB Nov 28, 1960, MRN [id number]  Patient Location: Home Provider Location: Home  PCP:  Marletta Lor, MD  Cardiologist:  Minus Breeding, MD  Electrophysiologist:  None   Evaluation Performed:  Follow-Up Visit  Chief Complaint:  CAD  History of Present Illness:    [name] is a 58 y.o. female follow up of coronary disease since her 27s. She had her first heart attack at the age of 74 and it was treated with a stent to her left circumflex. In July 2006, she underwent her most recent catheterization for recurrent angina. This showed in-stent restenosis of the circumflex as well as 80% lesion in the LAD and first diagonal.  She underwent Cypher DES placement for in-stent restenosis of the left circumflex as well as to the LAD.Dr Percival Spanish saw her this past summer and she complained of chest pain and jaw pain. Coronary CT was done July 2019. However, because of previous stenting and her body habitus this was not adequate to exclude obstructive coronary disease.   In February she complained of jaw pain and chest pain, one episode  made her break out into a sweat.  She [facility] who convinced her to proceed with coronary angiogram. This was done 10/31/2018 and revealed patent LAD and CFX stents with a new pCFX 85% stenosis.  This was treated with PCI/ DES.  Her LVF was 55-65%.   Since I last saw her she has done well.  The patient denies any new symptoms such as chest discomfort, neck or arm discomfort. There has been no new shortness of breath, PND or orthopnea. There have been no reported palpitations, presyncope or syncope.  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  She takes smoke breaks to go outside.    The patient does not have symptoms concerning for COVID-19 infection (fever, chills, cough, or new shortness of breath).    Past Medical History:  Diagnosis Date  . CAD (coronary artery disease)    cypher stent  . GERD (gastroesophageal reflux disease)   . Headache   . Heart attack ([place])    x 2  . HLD (hyperlipidemia)   . HTN (hypertension)   . Humerus fracture   . Obesity   . Tinnitus    Past Surgical History:  Procedure Laterality Date  . APPENDECTOMY    . CARDIAC CATHETERIZATION    . CORONARY STENT INTERVENTION N/A 10/31/2018   Procedure: CORONARY STENT INTERVENTION;  Surgeon: Leonie Man, MD;  Location: [place] CV LAB;  Service: Cardiovascular;  Laterality: N/A;  . coronary stents     x 4  .  EYE SURGERY     lasik  . FRACTURE SURGERY     left ankle  . LEFT HEART CATH AND CORONARY ANGIOGRAPHY N/A 10/31/2018   Procedure: LEFT HEART CATH AND CORONARY ANGIOGRAPHY;  Surgeon: Marykay Lex, MD;  Location: [facility] INVASIVE CV LAB;  Service: Cardiovascular;  Laterality: N/A;  . MULTIPLE TOOTH EXTRACTIONS    . ORIF HUMERUS FRACTURE Right 04/01/2017   Procedure: OPEN REDUCTION INTERNAL FIXATION (ORIF) RIGHT PROXIMAL HUMERUS FRACTURE;  Surgeon: Tarry Kos, MD;  Location: [facility] OR;  Service: Orthopedics;  Laterality: Right;     Current Meds  Medication Sig  .  aspirin EC 81 MG EC tablet Take 1 tablet (81 mg total) by mouth daily.  . clopidogrel (PLAVIX) 75 MG tablet Take 1 tablet (75 mg total) by mouth daily.  . diclofenac (VOLTAREN) 75 MG EC tablet TAKE 1 TABLET BY MOUTH TWICE A DAY  . diclofenac sodium (VOLTAREN) 1 % GEL APPLY 2 GRAMS TO AFFECTED AREA 4 TIMES A DAY (Patient taking differently: Apply 2 g topically 4 (four) times daily as needed (joint pain). )  . diphenhydrAMINE (BENADRYL) 50 MG tablet Take 1 tablet (50 mg total) by mouth at bedtime as needed for itching.  . ezetimibe (ZETIA) 10 MG tablet Take 1 tablet (10 mg total) by mouth daily.  . furosemide (LASIX) 40 MG tablet TAKE 2 TABLETS BY MOUTH EVERY MORNING AND 1 TABLETS IN THE EVENING (Patient taking differently: Take 40-80 mg by mouth See admin instructions. Take 80mg  in the morning and 40mg  in the evening)  . isosorbide mononitrate (IMDUR) 60 MG 24 hr tablet TAKE 1 TABLET BY MOUTH EVERY DAY  . metoprolol tartrate (LOPRESSOR) 25 MG tablet TAKE 1 TABLET BY MOUTH TWICE A DAY (Patient taking differently: Take 25 mg by mouth 2 (two) times daily. )  . nitroGLYCERIN (NITROSTAT) 0.4 MG SL tablet Place 1 tablet (0.4 mg total) under the tongue every 5 (five) minutes as needed (chest pain).  . pantoprazole (PROTONIX) 40 MG tablet TAKE 1 TABLET BY MOUTH EVERY DAY  . ramipril (ALTACE) 2.5 MG capsule TAKE 1 CAPSULE (2.5 MG TOTAL) BY MOUTH DAILY.  . rosuvastatin (CRESTOR) 40 MG tablet TAKE 1 TABLET BY MOUTH EVERY DAY (Patient taking differently: Take 40 mg by mouth daily. )     Allergies:   Iodine   Social History   Tobacco Use  . Smoking status: Current Some Day Smoker    Packs/day: 1.00    Types: Cigarettes  . Smokeless tobacco: Never Used  . Tobacco comment: 2 ppd since age 49  Substance Use Topics  . Alcohol use: Not Currently    Comment: occ  . Drug use: No     Family Hx: The patient's family history includes Aortic aneurysm in her mother; Heart attack in her mother; Heart attack  (age of onset: 18) in her father.  ROS:   Please see the history of present illness.    No All other systems reviewed and are negative.   Prior CV studies:   The following studies were reviewed today:    Labs/Other Tests and Data Reviewed:    EKG:  No ECG reviewed.  Recent Labs: 11/01/2018: BUN 11; Creatinine, Ser 0.85; Hemoglobin 13.9; Platelets 198; Potassium 4.0; Sodium 139   Recent Lipid Panel Lab Results  Component Value Date/Time   CHOL 182 01/27/2018 04:29 PM   TRIG 197 (H) 01/27/2018 04:29 PM   HDL 42 01/27/2018 04:29 PM   CHOLHDL 4.3 01/27/2018 04:29 PM  CHOLHDL 7 10/26/2013 11:04 AM   LDLCALC 101 (H) 01/27/2018 04:29 PM   LDLDIRECT 192.8 10/26/2013 11:04 AM    Wt Readings from Last 3 Encounters:  08/09/19 243 lb (110.2 kg)  11/09/18 256 lb (116.1 kg)  11/01/18 246 lb 14.6 oz (112 kg)     Objective:    Vital Signs:  Ht 5\' 3"  (1.6 m)   Wt 243 lb (110.2 kg)   BMI 43.05 kg/m      ASSESSMENT & PLAN:    CAD S/P percutaneous coronary angioplasty The patient has no new sypmtoms.  No further cardiovascular testing is indicated.  We will continue with aggressive risk reduction and meds as listed.  TOBACCO USER We talked briefly about this.  She has not wanted to consider this at this point.  There is a great deal of stress ongoing.   Essential hypertension She does not know her BP and we talked about getting a BP cuff.    Dyslipidemia, goal LDL below 70 She agrees to come in in Jan for a lipid profile.   Morbid obesity (HCC) We have talked about this in the past.   COVID-19 Education: The signs and symptoms of COVID-19 were discussed with the patient and how to seek care for testing (follow up with PCP or arrange E-visit).  She has been completely in the house. The importance of social distancing was discussed today.  Time:   Today, I have spent 13 minutes with the patient with telehealth technology discussing the above problems.      Medication Adjustments/Labs and Tests Ordered: Current medicines are reviewed at length with the patient today.  Concerns regarding medicines are outlined above.   Tests Ordered: Orders Placed This Encounter  Procedures  . Lipid panel  . Hepatic function panel    Medication Changes: No orders of the defined types were placed in this encounter.   Follow Up:  In Person in person in six months.   Signed, Rollene RotundaJames Tanji Storrs, MD  08/09/2019 12:06 PM    [facility]

## 2019-08-09 ENCOUNTER — Telehealth (INDEPENDENT_AMBULATORY_CARE_PROVIDER_SITE_OTHER): Payer: 59 | Admitting: Cardiology

## 2019-08-09 ENCOUNTER — Encounter: Payer: Self-pay | Admitting: Cardiology

## 2019-08-09 VITALS — Ht 63.0 in | Wt 243.0 lb

## 2019-08-09 DIAGNOSIS — I10 Essential (primary) hypertension: Secondary | ICD-10-CM

## 2019-08-09 DIAGNOSIS — I2510 Atherosclerotic heart disease of native coronary artery without angina pectoris: Secondary | ICD-10-CM

## 2019-08-09 DIAGNOSIS — E785 Hyperlipidemia, unspecified: Secondary | ICD-10-CM

## 2019-08-09 DIAGNOSIS — F17200 Nicotine dependence, unspecified, uncomplicated: Secondary | ICD-10-CM

## 2019-08-09 NOTE — Patient Instructions (Signed)
Medication Instructions:  Your physician recommends that you continue on your current medications as directed. Please refer to the Current Medication list given to you today.  If you need a refill on your cardiac medications before your next appointment, please call your pharmacy.   Lab work: Come get labs for Lipids and Hepatic Function in January If you have labs (blood work) drawn today and your tests are completely normal, you will receive your results only by: [place] (if you have MyChart) OR A paper copy in the mail If you have any lab test that is abnormal or we need to change your treatment, we will call you to review the results.  Testing/Procedures: NONE  Follow-Up: At [facility], you and your health needs are our priority.  As part of our continuing mission to provide you with exceptional heart care, we have created designated Provider Care Teams.  These Care Teams include your primary Cardiologist (physician) and Advanced Practice Providers (APPs -  Physician Assistants and Nurse Practitioners) who all work together to provide you with the care you need, when you need it. You may see Minus Breeding, MD or one of the following Advanced Practice Providers on your designated Care Team:    Rosaria Ferries, PA-C  Jory Sims, DNP, ANP  Cadence Kathlen Mody, NP  Your physician wants you to follow-up in: 6 months. You will receive a reminder letter in the mail two months in advance. If you don't receive a letter, please call our office to schedule the follow-up appointment.  Any Other Special Instructions Will Be Listed Below (If Applicable). You can come get your labs at our office or any [facility] facility anytime Monday-Friday 8am-4pm. You do not need an appointment.

## 2019-10-27 ENCOUNTER — Other Ambulatory Visit: Payer: Self-pay | Admitting: Cardiology

## 2019-11-04 ENCOUNTER — Other Ambulatory Visit: Payer: Self-pay | Admitting: Cardiology

## 2020-02-01 ENCOUNTER — Other Ambulatory Visit: Payer: Self-pay | Admitting: Cardiology

## 2020-02-08 ENCOUNTER — Other Ambulatory Visit: Payer: Self-pay | Admitting: Cardiology

## 2020-02-11 NOTE — Telephone Encounter (Signed)
Rx request sent to pharmacy.  

## 2020-02-17 ENCOUNTER — Other Ambulatory Visit: Payer: Self-pay | Admitting: Cardiology

## 2020-05-04 ENCOUNTER — Other Ambulatory Visit: Payer: Self-pay | Admitting: Cardiology

## 2020-05-14 ENCOUNTER — Other Ambulatory Visit (INDEPENDENT_AMBULATORY_CARE_PROVIDER_SITE_OTHER): Payer: Self-pay | Admitting: Physician Assistant

## 2020-05-23 ENCOUNTER — Other Ambulatory Visit: Payer: Self-pay

## 2020-05-23 ENCOUNTER — Other Ambulatory Visit: Payer: Self-pay | Admitting: Cardiology

## 2020-05-23 NOTE — Telephone Encounter (Signed)
*  STAT* If patient is at the pharmacy, call can be transferred to refill team.   1. Which medications need to be refilled? (please list name of each medication and dose if known) diclofenac (VOLTAREN) 75 MG EC tablet  2. Which pharmacy/location (including street and city if local pharmacy) is medication to be sent to? [id number] [place], [place] - [address]  3. Do they need a 30 day or 90 day supply? 90

## 2020-06-09 NOTE — Telephone Encounter (Signed)
Returned the call to the patient. She stated that she was calling for a refill on the Voltaren tablet (not the gel). She has been advised that this was refilled by PCP last. She stated that she felt like Dr. Antoine Poche was the one that refilled it last time. She has been advised that this is not a cardiac medication but she would still like a message sent to Dr. Antoine Poche to see if he would fill it.

## 2020-06-09 NOTE — Telephone Encounter (Signed)
Follow upo:     Patient calling to check the status of her refill that was called on 09/036/21, patient has not revised medication and her right arm has been hurting. Please fill medications.

## 2020-06-10 NOTE — Telephone Encounter (Signed)
I would like for this to come from the PCP if it is going to be ongoing.

## 2020-06-13 NOTE — Telephone Encounter (Signed)
Advised patient, verbalized understanding. She has already reached out to PCP

## 2020-09-30 ENCOUNTER — Telehealth: Payer: Self-pay

## 2020-09-30 NOTE — Telephone Encounter (Signed)
Prior authorization approved

## 2020-09-30 NOTE — Telephone Encounter (Signed)
Prior authorization completed for Ezetimibe, awaiting response.   Key: [id number] - PA Case ID: [id number]

## 2020-11-07 ENCOUNTER — Other Ambulatory Visit: Payer: Self-pay | Admitting: Cardiology

## 2021-02-04 ENCOUNTER — Other Ambulatory Visit: Payer: Self-pay | Admitting: Cardiology

## 2021-02-18 ENCOUNTER — Other Ambulatory Visit: Payer: Self-pay | Admitting: Cardiology

## 2021-02-18 MED ORDER — ROSUVASTATIN CALCIUM 40 MG PO TABS: 40.0000 mg | ORAL_TABLET | Freq: Every day | ORAL | 0 refills | Status: DC

## 2021-02-18 MED ORDER — NITROGLYCERIN 0.4 MG SL SUBL: 0.4000 mg | SUBLINGUAL_TABLET | SUBLINGUAL | 3 refills | Status: AC | PRN

## 2021-02-18 MED ORDER — METOPROLOL TARTRATE 25 MG PO TABS: 25.0000 mg | ORAL_TABLET | Freq: Two times a day (BID) | ORAL | 0 refills | Status: DC

## 2021-02-18 MED ORDER — PANTOPRAZOLE SODIUM 40 MG PO TBEC: 1.0000 | DELAYED_RELEASE_TABLET | Freq: Every day | ORAL | 0 refills | Status: DC

## 2021-02-18 MED ORDER — RAMIPRIL 2.5 MG PO CAPS: ORAL_CAPSULE | ORAL | 0 refills | Status: DC

## 2021-02-18 MED ORDER — ISOSORBIDE MONONITRATE ER 60 MG PO TB24: 60.0000 mg | ORAL_TABLET | Freq: Every day | ORAL | 0 refills | Status: DC

## 2021-02-18 MED ORDER — CLOPIDOGREL BISULFATE 75 MG PO TABS: 1.0000 | ORAL_TABLET | Freq: Every day | ORAL | 0 refills | Status: DC

## 2021-02-18 MED ORDER — EZETIMIBE 10 MG PO TABS: 10.0000 mg | ORAL_TABLET | Freq: Every day | ORAL | 0 refills | Status: DC

## 2021-02-18 MED ORDER — FUROSEMIDE 40 MG PO TABS: ORAL_TABLET | ORAL | 0 refills | Status: DC

## 2021-02-18 NOTE — Telephone Encounter (Signed)
Patient overdue for visit with Antoine Poche, MD Spoke with patient - refilled all requested meds to new pharmacy - [facility] Scheduled appointment with MD on 03/17/21

## 2021-02-19 ENCOUNTER — Other Ambulatory Visit: Payer: Self-pay | Admitting: *Deleted

## 2021-03-15 NOTE — Progress Notes (Signed)
Cardiology Office Note   Date:  03/17/2021   ID:  Akeema, Broder 1961-01-15, MRN [id number]  PCP:  Gordy Savers, MD (Inactive)  Cardiologist:   Rollene Rotunda, MD Referring:  Gordy Savers, MD (Inactive)  Chief Complaint  Patient presents with   Leg Swelling       History of Present Illness: [name] is a 60 y.o. female who presents for follow up of  coronary disease since her 30s.  She had her first heart attack at the age of 78 and it was treated with a stent to her left circumflex. In July 2006, she underwent her most recent catheterization for recurrent angina. This showed in-stent restenosis of the circumflex as well as 80% lesion in the LAD and first diagonal.  She underwent Cypher DES placement for in-stent restenosis of the left circumflex as well as to the LAD. Dr Antoine Poche saw her this past summer and she complained of chest pain and jaw pain.  Coronary CT was done July 2019.  However, because of previous stenting and her body habitus this  was not adequate to exclude obstructive coronary disease.    In February 2020 she complained of jaw pain and chest pain, one episode made her break out into a sweat.  She saw [facility] who convinced her to proceed with coronary angiogram. This was done 10/31/2018 and revealed patent LAD and CFX stents with a new pCFX 85% stenosis.  This was treated with PCI/ DES.  Her LVF was 55-65%.   Since I last saw her she has had increased lower extremity swelling.  She states she has been to podiatrist but I do not have these records.  She states she had arterial and venous studies and was not found to have any significant abnormalities diagnoses.  However, she continues to have increased lower extremity swelling.  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  He does not abuse salt.  She does not drink excess fluid.  She does sleep in a chair since she broke her arm.  She never gets it  above her heart.  She says that when she does get up in the morning her feet are down compared to where they are.  She does have some erythema on her feet and calluses with very dry skin.  She does have shortness of breath but she says she has been very inactive and so she really cannot tell.  She is not really describing PND or orthopnea.  She is not describing new palpitations, presyncope or syncope.  She has gained some weight but has not been markedly elevated since I last saw her.  Tends to fluctuate.  She is not describing any of the chest discomfort that she had previously.    Past Medical History:  Diagnosis Date   CAD (coronary artery disease)    cypher stent   GERD (gastroesophageal reflux disease)    Headache    Heart attack (HCC)    x 2   HLD (hyperlipidemia)    HTN (hypertension)    Humerus fracture    Obesity    Tinnitus     Past Surgical History:  Procedure Laterality Date   APPENDECTOMY     CARDIAC CATHETERIZATION     CORONARY STENT INTERVENTION N/A 10/31/2018   Procedure: CORONARY STENT INTERVENTION;  Surgeon: Marykay Lex, MD;  Location: [facility] INVASIVE CV LAB;  Service: Cardiovascular;  Laterality:  N/A;   coronary stents     x 4   EYE SURGERY     lasik   FRACTURE SURGERY     left ankle   LEFT HEART CATH AND CORONARY ANGIOGRAPHY N/A 10/31/2018   Procedure: LEFT HEART CATH AND CORONARY ANGIOGRAPHY;  Surgeon: Marykay Lex, MD;  Location: [facility] INVASIVE CV LAB;  Service: Cardiovascular;  Laterality: N/A;   MULTIPLE TOOTH EXTRACTIONS     ORIF HUMERUS FRACTURE Right 04/01/2017   Procedure: OPEN REDUCTION INTERNAL FIXATION (ORIF) RIGHT PROXIMAL HUMERUS FRACTURE;  Surgeon: Tarry Kos, MD;  Location: [facility] OR;  Service: Orthopedics;  Laterality: Right;     Current Outpatient Medications  Medication Sig Dispense Refill   aspirin EC 81 MG EC tablet Take 1 tablet (81 mg total) by mouth daily.     clopidogrel (PLAVIX) 75 MG tablet Take 1 tablet (75 mg total) by mouth  daily. 90 tablet 0   ezetimibe (ZETIA) 10 MG tablet Take 1 tablet (10 mg total) by mouth daily. 90 tablet 0   isosorbide mononitrate (IMDUR) 60 MG 24 hr tablet Take 1 tablet (60 mg total) by mouth daily. 90 tablet 0   metoprolol tartrate (LOPRESSOR) 25 MG tablet Take 1 tablet (25 mg total) by mouth 2 (two) times daily. 180 tablet 0   nitroGLYCERIN (NITROSTAT) 0.4 MG SL tablet Place 1 tablet (0.4 mg total) under the tongue every 5 (five) minutes as needed (chest pain). 25 tablet 3   pantoprazole (PROTONIX) 40 MG tablet Take 1 tablet (40 mg total) by mouth daily. 90 tablet 0   potassium chloride SA (KLOR-CON) 20 MEQ tablet Take 1 tablet (20 mEq total) by mouth 2 (two) times daily. 180 tablet 3   ramipril (ALTACE) 2.5 MG capsule TAKE 1 CAPSULE BY MOUTH EVERY DAY 90 capsule 0   rosuvastatin (CRESTOR) 40 MG tablet Take 1 tablet (40 mg total) by mouth daily. 90 tablet 0   torsemide (DEMADEX) 20 MG tablet Take 60 mg in the morning (3 tablets) and 40 mg in the afternoon (2 tablets) 450 tablet 3   No current facility-administered medications for this visit.    Allergies:   Iodine    ROS:  Please see the history of present illness.   Otherwise, review of systems are positive for none.   All other systems are reviewed and negative.    PHYSICAL EXAM: VS:  BP 132/76   Pulse 67   Ht 5\' 3"  (1.6 m)   Wt 252 lb (114.3 kg)   SpO2 96%   BMI 44.64 kg/m  , BMI Body mass index is 44.64 kg/m. GENERAL:  Well appearing NECK:  No jugular venous distention, waveform within normal limits, carotid upstroke brisk and symmetric, no bruits, no thyromegaly LUNGS:  Clear to auscultation bilaterally CHEST:  Unremarkable HEART:  PMI not displaced or sustained,S1 and S2 within normal limits, no S3, no S4, no clicks, no rubs, no murmurs ABD:  Flat, positive bowel sounds normal in frequency in pitch, no bruits, no rebound, no guarding, no midline pulsatile mass, no hepatomegaly, no splenomegaly EXT:  2 plus pulses  throughout, bilateral moderate lower extremity edema, no cyanosis no clubbing  EKG:  EKG is ordered today. The ekg ordered today demonstrates sinus rhythm, rate 67, axis within normal limits, intervals within normal limits, no acute ST-T wave changes.   Recent Labs: No results found for requested labs within last 8760 hours.    Lipid Panel    Component Value Date/Time  CHOL 182 01/27/2018 1629   TRIG 197 (H) 01/27/2018 1629   HDL 42 01/27/2018 1629   CHOLHDL 4.3 01/27/2018 1629   CHOLHDL 7 10/26/2013 1104   VLDL 53.6 (H) 10/26/2013 1104   LDLCALC 101 (H) 01/27/2018 1629   LDLDIRECT 192.8 10/26/2013 1104      Wt Readings from Last 3 Encounters:  03/17/21 252 lb (114.3 kg)  08/09/19 243 lb (110.2 kg)  11/09/18 256 lb (116.1 kg)      Other studies Reviewed: Additional studies/ records that were reviewed today include: None. Review of the above records demonstrates:  Please see elsewhere in the note.     ASSESSMENT AND PLAN:   CAD S/P percutaneous coronary angioplasty The patient has no new sypmtoms.  No further cardiovascular testing is indicated.  We will continue with aggressive risk reduction and meds as listed.  Essential hypertension The blood pressure is at target. No change in medications is indicated. We will continue with therapeutic lifestyle changes (TLC).   Edema I suspect this is venous insufficiency though I understand that there was not a diagnosis of this from her podiatrist per her report.  She is an active.  She keeps her feet down.  At this point I described to her how to keep her feet elevated and she is going to look into getting a Nutritional therapist.  I am going to change her from Lasix to Demadex giving her 60 mg in the morning and 40 mg in the afternoon.  She is going to take 40 mill equivalents of potassium.  She needs to come back in 1 week and get a basic metabolic profile and BNP.  I would also like to check an echocardiogram.     Current  medicines are reviewed at length with the patient today.  The patient does not have concerns regarding medicines.  The following changes have been made:  no change  Labs/ tests ordered today include:   Orders Placed This Encounter  Procedures   Pro b natriuretic peptide (BNP)[facility] [facility])   Basic Metabolic Panel (BMET)   EKG 12-Lead   ECHOCARDIOGRAM COMPLETE      Disposition:   FU with APP in about one month.     Signed, Rollene Rotunda, MD  03/17/2021 8:39 PM    [facility]

## 2021-03-17 ENCOUNTER — Other Ambulatory Visit: Payer: Self-pay

## 2021-03-17 ENCOUNTER — Ambulatory Visit: Payer: BLUE CROSS/BLUE SHIELD | Admitting: Cardiology

## 2021-03-17 ENCOUNTER — Encounter: Payer: Self-pay | Admitting: Cardiology

## 2021-03-17 VITALS — BP 132/76 | HR 67 | Ht 63.0 in | Wt 252.0 lb

## 2021-03-17 DIAGNOSIS — R0602 Shortness of breath: Secondary | ICD-10-CM | POA: Diagnosis not present

## 2021-03-17 DIAGNOSIS — I10 Essential (primary) hypertension: Secondary | ICD-10-CM | POA: Diagnosis not present

## 2021-03-17 DIAGNOSIS — I2510 Atherosclerotic heart disease of native coronary artery without angina pectoris: Secondary | ICD-10-CM

## 2021-03-17 DIAGNOSIS — Z720 Tobacco use: Secondary | ICD-10-CM

## 2021-03-17 DIAGNOSIS — E785 Hyperlipidemia, unspecified: Secondary | ICD-10-CM

## 2021-03-17 MED ORDER — POTASSIUM CHLORIDE CRYS ER 20 MEQ PO TBCR: 20.0000 meq | EXTENDED_RELEASE_TABLET | Freq: Two times a day (BID) | ORAL | 3 refills | Status: DC

## 2021-03-17 MED ORDER — TORSEMIDE 20 MG PO TABS: ORAL_TABLET | ORAL | 3 refills | Status: DC

## 2021-03-17 NOTE — Patient Instructions (Signed)
Medication Instructions:   STOP FUROSEMIDE  START TORSEMIDE 60 MG IN THE MORNING (3 TABLETS) AND 40 MG IN THE AFTERNOON (2 TABLETS)  START POTASSIUM CHLORIDE 20 MEQ ONE TABLET TWICE DAILY  *If you need a refill on your cardiac medications before your next appointment, please call your pharmacy*   Lab Work:  Your physician recommends that you return for lab work in: ONE WEEK  If you have labs (blood work) drawn today and your tests are completely normal, you will receive your results only by: [employer] (if you have MyChart) OR A paper copy in the mail If you have any lab test that is abnormal or we need to change your treatment, we will call you to review the results.   Testing/Procedures:  Your physician has requested that you have an echocardiogram. Echocardiography is a painless test that uses sound waves to create images of your heart. It provides your doctor with information about the size and shape of your heart and how well your heart's chambers and valves are working. This procedure takes approximately one hour. There are no restrictions for this procedure. [address]   Follow-Up: At [facility], you and your health needs are our priority.  As part of our continuing mission to provide you with exceptional heart care, we have created designated Provider Care Teams.  These Care Teams include your primary Cardiologist (physician) and Advanced Practice Providers (APPs -  Physician Assistants and Nurse Practitioners) who all work together to provide you with the care you need, when you need it.  We recommend signing up for the patient portal called "MyChart".  Sign up information is provided on this After Visit Summary.  MyChart is used to connect with patients for Virtual Visits (Telemedicine).  Patients are able to view lab/test results, encounter notes, upcoming appointments, etc.  Non-urgent messages can be sent to your provider as well.   To learn more  about what you can do with MyChart, go to [email or web address].    Your next appointment:   1 month(s)  The format for your next appointment:   In Person  Provider:   You will see one of the following Advanced Practice Providers on your designated Care Team:   Theodore Demark, PA-C Joni Reining, DNP, ANP   Other Instructions [facility]

## 2021-04-10 ENCOUNTER — Ambulatory Visit (HOSPITAL_COMMUNITY): Payer: BLUE CROSS/BLUE SHIELD | Attending: Cardiovascular Disease

## 2021-04-10 ENCOUNTER — Other Ambulatory Visit: Payer: Self-pay

## 2021-04-10 DIAGNOSIS — R0602 Shortness of breath: Secondary | ICD-10-CM | POA: Diagnosis present

## 2021-04-10 LAB — ECHOCARDIOGRAM COMPLETE
Area-P 1/2: 2.68 cm2
S' Lateral: 3.3 cm

## 2021-04-10 MED ORDER — PERFLUTREN LIPID MICROSPHERE
1.0000 mL | INTRAVENOUS | Status: AC | PRN
  Administered 2021-04-10: 1 mL via INTRAVENOUS

## 2021-05-05 ENCOUNTER — Other Ambulatory Visit: Payer: Self-pay

## 2021-05-05 ENCOUNTER — Encounter: Payer: Self-pay | Admitting: Physician Assistant

## 2021-05-05 ENCOUNTER — Ambulatory Visit: Payer: BLUE CROSS/BLUE SHIELD | Admitting: Physician Assistant

## 2021-05-05 VITALS — BP 98/67 | HR 66 | Ht 63.0 in | Wt 239.8 lb

## 2021-05-05 DIAGNOSIS — R600 Localized edema: Secondary | ICD-10-CM

## 2021-05-05 DIAGNOSIS — I2510 Atherosclerotic heart disease of native coronary artery without angina pectoris: Secondary | ICD-10-CM | POA: Diagnosis not present

## 2021-05-05 DIAGNOSIS — E785 Hyperlipidemia, unspecified: Secondary | ICD-10-CM | POA: Diagnosis not present

## 2021-05-05 DIAGNOSIS — I10 Essential (primary) hypertension: Secondary | ICD-10-CM | POA: Diagnosis not present

## 2021-05-05 NOTE — Patient Instructions (Signed)
Medication Instructions:  Your physician recommends that you continue on your current medications as directed. Please refer to the Current Medication list given to you today.  *If you need a refill on your cardiac medications before your next appointment, please call your pharmacy*  Lab Work: NONE ordered at this time of appointment   If you have labs (blood work) drawn today and your tests are completely normal, you will receive your results only by: . MyChart Message (if you have MyChart) OR . A paper copy in the mail If you have any lab test that is abnormal or we need to change your treatment, we will call you to review the results.  Testing/Procedures: NONE ordered at this time of appointment   Follow-Up: At [facility], you and your health needs are our priority.  As part of our continuing mission to provide you with exceptional heart care, we have created designated Provider Care Teams.  These Care Teams include your primary Cardiologist (physician) and Advanced Practice Providers (APPs -  Physician Assistants and Nurse Practitioners) who all work together to provide you with the care you need, when you need it.  Your next appointment:   3 month(s)  The format for your next appointment:   In Person  Provider:   James Hochrein, MD  Other Instructions   

## 2021-05-05 NOTE — Progress Notes (Signed)
Cardiology Office Note:    Date:  05/07/2021   ID:  [name], DOB [birth year], MRN [id number]  PCP:  Amy Savers, MD (Inactive)   [facility] Providers Cardiologist:  Amy Rotunda, MD     Referring MD: No ref. provider found   Chief Complaint  Patient presents with   Follow-up    Seen for Dr. Antoine Krueger    History of Present Illness:    [name] is a 60 y.o. female with a hx of CAD, hypertension, hyperlipidemia and obesity.  She had first heart attack at age 50 and received stent to her left circumflex artery.  In 2006, she underwent cardiac catheterization for recurrent angina, this revealed significant in-stent restenosis in the left circumflex artery as well as 80% lesion in the LAD and first diagonal.  She underwent Cypher DES placement to in-stent restenosis of left circumflex as well as LAD.  Coronary CT obtained in July 2019 did not adequately exclude obstructive disease.  In February 2020, she complained of significant jaw pain and chest pain.  She underwent a repeat cardiac catheterization on 10/31/2020 that showed patent LAD and left circumflex stent with new 85% proximal left circumflex lesion treated with DES, EF 55 to 65%.  Patient was last seen by Dr. Antoine Krueger on 03/09/2021 at which time she had a significant lower extremity edema.  Dr. Antoine Krueger felt the lower extremity edema was mainly due to venous insufficiency. Lasix was discontinued (she was previously on 80 mg a.Krueger. and 40 mg p.Krueger dosing).  She was started on torsemide 60 mg a.Krueger. and 40 mg p.Krueger.  She takes 20 meq twice daily dosing of potassium.  Basic metabolic panel, proBNP, echocardiogram were recommended.  However lab works were never done.  Echocardiogram obtained on 04/10/2021 showed EF 60 to 65%, normal pulmonary artery systolic pressure, no significant valve issue.  Patient presents today for follow-up.  Unfortunately, her lab still has not resulted yet even though she obtained lab work last  Friday.  I spoke with our [facility] phlebotomist, she will try to pull the lab result in the next day also, if she still cannot pull the lab result, she will need to repeat blood work this Thursday when she accompanying her husband during his cardiology visit.  Otherwise she denies any significant chest discomfort or worsening shortness of breath.  Past Medical History:  Diagnosis Date   CAD (coronary artery disease)    cypher stent   GERD (gastroesophageal reflux disease)    Headache    Heart attack (HCC)    x 2   HLD (hyperlipidemia)    HTN (hypertension)    Humerus fracture    Obesity    Tinnitus     Past Surgical History:  Procedure Laterality Date   APPENDECTOMY     CARDIAC CATHETERIZATION     CORONARY STENT INTERVENTION N/A 10/31/2018   Procedure: CORONARY STENT INTERVENTION;  Surgeon: Amy Lex, MD;  Location: [facility] INVASIVE CV LAB;  Service: Cardiovascular;  Laterality: N/A;   coronary stents     x 4   EYE SURGERY     lasik   FRACTURE SURGERY     left ankle   LEFT HEART CATH AND CORONARY ANGIOGRAPHY N/A 10/31/2018   Procedure: LEFT HEART CATH AND CORONARY ANGIOGRAPHY;  Surgeon: Amy Lex, MD;  Location: [facility] INVASIVE CV LAB;  Service: Cardiovascular;  Laterality: N/A;   MULTIPLE TOOTH EXTRACTIONS     ORIF HUMERUS FRACTURE Right 04/01/2017   Procedure:  OPEN REDUCTION INTERNAL FIXATION (ORIF) RIGHT PROXIMAL HUMERUS FRACTURE;  Surgeon: [name], Amy M, MD;  Location: [facility] OR;  Service: Orthopedics;  Laterality: Right;    Current Medications: Current Meds  Medication Sig   aspirin EC 81 MG EC tablet Take 1 tablet (81 mg total) by mouth daily.   clopidogrel (PLAVIX) 75 MG tablet Take 1 tablet (75 mg total) by mouth daily.   ezetimibe (ZETIA) 10 MG tablet Take 1 tablet (10 mg total) by mouth daily.   isosorbide mononitrate (IMDUR) 60 MG 24 hr tablet Take 1 tablet (60 mg total) by mouth daily.   metoprolol tartrate (LOPRESSOR) 25 MG tablet Take 1 tablet (25 mg total) by  mouth 2 (two) times daily.   pantoprazole (PROTONIX) 40 MG tablet Take 1 tablet (40 mg total) by mouth daily.   potassium chloride SA (KLOR-CON) 20 MEQ tablet Take 1 tablet (20 mEq total) by mouth 2 (two) times daily.   ramipril (ALTACE) 2.5 MG capsule TAKE 1 CAPSULE BY MOUTH EVERY DAY   rosuvastatin (CRESTOR) 40 MG tablet Take 1 tablet (40 mg total) by mouth daily.   torsemide (DEMADEX) 20 MG tablet Take 60 mg in the morning (3 tablets) and 40 mg in the afternoon (2 tablets)     Allergies:   Iodine   Social History   Socioeconomic History   Marital status: Married    Spouse name: Not on file   Number of children: Not on file   Years of education: Not on file   Highest education level: Not on file  Occupational History   Occupation: Development worker, communitymortgage loan officer  Tobacco Use   Smoking status: Some Days    Packs/day: 1.00    Types: Cigarettes   Smokeless tobacco: Never   Tobacco comments:    2 ppd since age 60  Vaping Use   Vaping Use: Never used  Substance and Sexual Activity   Alcohol use: Not Currently    Comment: occ   Drug use: No   Sexual activity: Not on file  Other Topics Concern   Not on file  Social History Narrative   Not on file   Social Determinants of Health   Financial Resource Strain: Not on file  Food Insecurity: Not on file  Transportation Needs: Not on file  Physical Activity: Not on file  Stress: Not on file  Social Connections: Not on file     Family History: The patient's family history includes Aortic aneurysm in her mother; Heart attack in her mother; Heart attack (age of onset: 6030) in her father.  ROS:   Please see the history of present illness.     All other systems reviewed and are negative.  EKGs/Labs/Other Studies Reviewed:    The following studies were reviewed today:  Cath 10/31/2018 CULPRIT LESION: Prox Cx lesion is 85% stenosed. A drug-eluting stent was successfully placed using a STENT RESOLUTE ONYX 2.75X18. -Postdilated to 3.1  mm Post intervention, there is a 0% residual stenosis. --------------------------------------- Prox Cx to Mid Cx Cypher DES on top of BMS 5% stenosed. Dist Cx lesion is 50% stenosed beyond prior stent. Prox LAD-1 Cypher DES is 20% stenosed. Prox LAD-2 lesion is 30% stenosed beyond stent. Ost 1st Diag lesion is 70% stenosed. Ost 2nd Diag lesion is 40% stenosed. Prox RCA-1 lesion is 30% stenosed. Non-stenotic (Aneurysmal) Prox RCA-2 lesion. --------------------------------------- The left ventricular systolic function is normal. The left ventricular ejection fraction is 55-65% by visual estimate. LV end diastolic pressure is moderately elevated.  SUMMARY Multivessel CAD with culprit lesion being 85% proximal circumflex prior to AVG-dCX(OM) bifurcation with widely patent AV groove circumflex and proximal LAD (minimal in-stent restenosis@ 1st & 2nd Diag branches with stable ostial disease of both jailed diagonal branches). Successful proximal circumflex DES PCI using resolute Onyx DES 2.75 mm x 18 mm (3.1 mm) Previously noted proximal RCA 40% stenosis is now a focal berry aneurysm contained. Normal LVEF with no regional wall motion abnormalities and moderately elevated LVEDP.     RECOMMENDATIONS Monitor overnight.  Discharge home in the morning Give 1 more dose of oral prednisone for contrast hypersensitivity Continue aggressive risk factor modification -continue current cardiac meds, titrate as necessary in the outpatient setting.   Follow-Up with Dr. Antoine Krueger or Team APP.    EKG:  EKG is not ordered today.    Recent Labs: No results found for requested labs within last 8760 hours.  Recent Lipid Panel    Component Value Date/Time   CHOL 182 01/27/2018 1629   TRIG 197 (H) 01/27/2018 1629   HDL 42 01/27/2018 1629   CHOLHDL 4.3 01/27/2018 1629   CHOLHDL 7 10/26/2013 1104   VLDL 53.6 (H) 10/26/2013 1104   LDLCALC 101 (H) 01/27/2018 1629   LDLDIRECT 192.8 10/26/2013 1104      Risk Assessment/Calculations:           Physical Exam:    VS:  BP 98/67 (BP Location: Left Arm, Patient Position: Sitting, Cuff Size: Large)   Pulse 66   Ht 5\' 3"  (1.6 Krueger)   Wt 239 lb 12.8 oz (108.8 kg)   SpO2 98%   BMI 42.48 kg/Krueger     Wt Readings from Last 3 Encounters:  05/05/21 239 lb 12.8 oz (108.8 kg)  03/17/21 252 lb (114.3 kg)  08/09/19 243 lb (110.2 kg)     GEN:  Well nourished, well developed in no acute distress HEENT: Normal NECK: No JVD; No carotid bruits LYMPHATICS: No lymphadenopathy CARDIAC: RRR, no murmurs, rubs, gallops RESPIRATORY:  Clear to auscultation without rales, wheezing or rhonchi  ABDOMEN: Soft, non-tender, non-distended MUSCULOSKELETAL:  No edema; No deformity  SKIN: Warm and dry NEUROLOGIC:  Alert and oriented x 3 PSYCHIATRIC:  Normal affect   ASSESSMENT:    1. Leg edema   2. Coronary artery disease involving native coronary artery of native heart without angina pectoris   3. Essential hypertension   4. Hyperlipidemia LDL goal <70    PLAN:    In order of problems listed above:  Leg edema: lower extremity edema did improve after she was switched to torsemide.  She had repeat blood work last Friday, however blood work still has not resulted in epic system.  I spoke with our [facility] phlebotomist, she will try to pull the lab result in the next day.  If unable to pull the lab result from [facility] system, will need repeat blood work this Thursday when she comes in accompanied her husband during his cardiology visit.  On aspirin and Plavix  CAD: Denies any recent chest pain  Hypertension: Blood pressure stable  Hyperlipidemia: On Crestor and Zetia        Medication Adjustments/Labs and Tests Ordered: Current medicines are reviewed at length with the patient today.  Concerns regarding medicines are outlined above.  No orders of the defined types were placed in this encounter.  No orders of the defined types were placed in this  encounter.   Patient Instructions  Medication Instructions:  Your physician recommends that you continue on  your current medications as directed. Please refer to the Current Medication list given to you today.  *If you need a refill on your cardiac medications before your next appointment, please call your pharmacy*  Lab Work: NONE ordered at this time of appointment   If you have labs (blood work) drawn today and your tests are completely normal, you will receive your results only by: MyChart Message (if you have MyChart) OR A paper copy in the mail If you have any lab test that is abnormal or we need to change your treatment, we will call you to review the results.  Testing/Procedures: NONE ordered at this time of appointment   Follow-Up: At [facility], you and your health needs are our priority.  As part of our continuing mission to provide you with exceptional heart care, we have created designated Provider Care Teams.  These Care Teams include your primary Cardiologist (physician) and Advanced Practice Providers (APPs -  Physician Assistants and Nurse Practitioners) who all work together to provide you with the care you need, when you need it.  Your next appointment:   3 month(s)  The format for your next appointment:   In Person  Provider:   Rollene Rotunda, MD  Other Instructions    Signed, Azalee Course, PA  05/07/2021 11:33 PM    [facility]

## 2021-05-07 ENCOUNTER — Encounter: Payer: Self-pay | Admitting: Physician Assistant

## 2021-05-08 LAB — BASIC METABOLIC PANEL
BUN/Creatinine Ratio: 16 (ref 9–23)
BUN: 17 mg/dL (ref 6–24)
CO2: 24 mmol/L (ref 20–29)
Calcium: 9.8 mg/dL (ref 8.7–10.2)
Chloride: 98 mmol/L (ref 96–106)
Creatinine, Ser: 1.05 mg/dL — ABNORMAL HIGH (ref 0.57–1.00)
Glucose: 91 mg/dL (ref 65–99)
Potassium: 4 mmol/L (ref 3.5–5.2)
Sodium: 140 mmol/L (ref 134–144)
eGFR: 61 mL/min/{1.73_m2} (ref >59)

## 2021-05-08 LAB — PRO B NATRIURETIC PEPTIDE: NT-Pro BNP: 25 pg/mL (ref 0–287)

## 2021-05-13 ENCOUNTER — Encounter: Payer: Self-pay | Admitting: *Deleted

## 2021-06-23 ENCOUNTER — Other Ambulatory Visit: Payer: Self-pay | Admitting: Cardiology

## 2021-08-04 DIAGNOSIS — R600 Localized edema: Secondary | ICD-10-CM | POA: Insufficient documentation

## 2021-08-04 NOTE — Progress Notes (Deleted)
Cardiology Office Note   Date:  08/04/2021   ID:  Gionni, Freese 1960-12-10, MRN [id number]  PCP:  Gordy Savers, MD (Inactive)  Cardiologist:   Rollene Rotunda, MD Referring:  Gordy Savers, MD (Inactive)  No chief complaint on file.      History of Present Illness: [name] is a 60 y.o. female who presents for follow up of  coronary disease since her 30s.  She had her first heart attack at the age of 45 and it was treated with a stent to her left circumflex. In July 2006, she underwent her most recent catheterization for recurrent angina. This showed in-stent restenosis of the circumflex as well as 80% lesion in the LAD and first diagonal.  She underwent Cypher DES placement for in-stent restenosis of the left circumflex as well as to the LAD. Dr Antoine Poche saw her this past summer and she complained of chest pain and jaw pain.  Coronary CT was done July 2019.  However, because of previous stenting and her body habitus this  was not adequate to exclude obstructive coronary disease.    In February 2020 she complained of jaw pain and chest pain, one episode made her break out into a sweat.  She saw [facility] who convinced her to proceed with coronary angiogram. This was done 10/31/2018 and revealed patent LAD and CFX stents with a new pCFX 85% stenosis.  This was treated with PCI/ DES.  Her LVF was 55-65%.   Since I last saw her she saw Azalee Course for PAc and she had decreased edema on Demadex. ***   *** she has had increased lower extremity swelling.  She states she has been to podiatrist but I do not have these records.  She states she had arterial and venous studies and was not found to have any significant abnormalities diagnoses.  However, she continues to have increased lower extremity swelling.  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  He does not abuse salt.  She does not drink excess fluid.  She does sleep in a  chair since she broke her arm.  She never gets it above her heart.  She says that when she does get up in the morning her feet are down compared to where they are.  She does have some erythema on her feet and calluses with very dry skin.  She does have shortness of breath but she says she has been very inactive and so she really cannot tell.  She is not really describing PND or orthopnea.  She is not describing new palpitations, presyncope or syncope.  She has gained some weight but has not been markedly elevated since I last saw her.  Tends to fluctuate.  She is not describing any of the chest discomfort that she had previously.    Past Medical History:  Diagnosis Date   CAD (coronary artery disease)    cypher stent   GERD (gastroesophageal reflux disease)    Headache    Heart attack (HCC)    x 2   HLD (hyperlipidemia)    HTN (hypertension)    Humerus fracture    Obesity    Tinnitus     Past Surgical History:  Procedure Laterality Date   APPENDECTOMY     CARDIAC CATHETERIZATION     CORONARY STENT INTERVENTION N/A 10/31/2018   Procedure: CORONARY STENT INTERVENTION;  Surgeon: Marykay Lex, MD;  Location: [facility] INVASIVE CV LAB;  Service: Cardiovascular;  Laterality: N/A;   coronary stents     x 4   EYE SURGERY     lasik   FRACTURE SURGERY     left ankle   LEFT HEART CATH AND CORONARY ANGIOGRAPHY N/A 10/31/2018   Procedure: LEFT HEART CATH AND CORONARY ANGIOGRAPHY;  Surgeon: Marykay Lex, MD;  Location: [facility] INVASIVE CV LAB;  Service: Cardiovascular;  Laterality: N/A;   MULTIPLE TOOTH EXTRACTIONS     ORIF HUMERUS FRACTURE Right 04/01/2017   Procedure: OPEN REDUCTION INTERNAL FIXATION (ORIF) RIGHT PROXIMAL HUMERUS FRACTURE;  Surgeon: Tarry Kos, MD;  Location: [facility] OR;  Service: Orthopedics;  Laterality: Right;     Current Outpatient Medications  Medication Sig Dispense Refill   ezetimibe (ZETIA) 10 MG tablet TAKE ONE TABLET BY MOUTH DAILY 90 tablet 1   pantoprazole (PROTONIX)  40 MG tablet TAKE ONE TABLET BY MOUTH DAILY 90 tablet 1   rosuvastatin (CRESTOR) 40 MG tablet TAKE ONE TABLET BY MOUTH DAILY 90 tablet 1   aspirin EC 81 MG EC tablet Take 1 tablet (81 mg total) by mouth daily.     clopidogrel (PLAVIX) 75 MG tablet Take 1 tablet (75 mg total) by mouth daily. 90 tablet 0   isosorbide mononitrate (IMDUR) 60 MG 24 hr tablet Take 1 tablet (60 mg total) by mouth daily. 90 tablet 0   metoprolol tartrate (LOPRESSOR) 25 MG tablet Take 1 tablet (25 mg total) by mouth 2 (two) times daily. 180 tablet 0   nitroGLYCERIN (NITROSTAT) 0.4 MG SL tablet Place 1 tablet (0.4 mg total) under the tongue every 5 (five) minutes as needed (chest pain). (Patient not taking: Reported on 05/05/2021) 25 tablet 3   potassium chloride SA (KLOR-CON) 20 MEQ tablet Take 1 tablet (20 mEq total) by mouth 2 (two) times daily. 180 tablet 3   ramipril (ALTACE) 2.5 MG capsule TAKE 1 CAPSULE BY MOUTH EVERY DAY 90 capsule 0   torsemide (DEMADEX) 20 MG tablet Take 60 mg in the morning (3 tablets) and 40 mg in the afternoon (2 tablets) 450 tablet 3   No current facility-administered medications for this visit.    Allergies:   Iodine    ROS:  Please see the history of present illness.   Otherwise, review of systems are positive for ***.   All other systems are reviewed and negative.    PHYSICAL EXAM: VS:  There were no vitals taken for this visit. , BMI There is no height or weight on file to calculate BMI. GENERAL:  Well appearing NECK:  No jugular venous distention, waveform within normal limits, carotid upstroke brisk and symmetric, no bruits, no thyromegaly LUNGS:  Clear to auscultation bilaterally CHEST:  Unremarkable HEART:  PMI not displaced or sustained,S1 and S2 within normal limits, no S3, no S4, no clicks, no rubs, *** murmurs ABD:  Flat, positive bowel sounds normal in frequency in pitch, no bruits, no rebound, no guarding, no midline pulsatile mass, no hepatomegaly, no splenomegaly EXT:   2 plus pulses throughout, no edema, no cyanosis no clubbing    ***GENERAL:  Well appearing NECK:  No jugular venous distention, waveform within normal limits, carotid upstroke brisk and symmetric, no bruits, no thyromegaly LUNGS:  Clear to auscultation bilaterally CHEST:  Unremarkable HEART:  PMI not displaced or sustained,S1 and S2 within normal limits, no S3, no S4, no clicks, no rubs, no murmurs ABD:  Flat, positive bowel sounds normal in frequency in pitch, no  bruits, no rebound, no guarding, no midline pulsatile mass, no hepatomegaly, no splenomegaly EXT:  2 plus pulses throughout, bilateral moderate lower extremity edema, no cyanosis no clubbing  EKG:  EKG is *** ordered today. The ekg ordered today demonstrates sinus rhythm, rate ***, axis within normal limits, intervals within normal limits, no acute ST-T wave changes.   Recent Labs: 05/01/2021: BUN 17; Creatinine, Ser 1.05; NT-Pro BNP 25; Potassium 4.0; Sodium 140    Lipid Panel    Component Value Date/Time   CHOL 182 01/27/2018 1629   TRIG 197 (H) 01/27/2018 1629   HDL 42 01/27/2018 1629   CHOLHDL 4.3 01/27/2018 1629   CHOLHDL 7 10/26/2013 1104   VLDL 53.6 (H) 10/26/2013 1104   LDLCALC 101 (H) 01/27/2018 1629   LDLDIRECT 192.8 10/26/2013 1104      Wt Readings from Last 3 Encounters:  05/05/21 239 lb 12.8 oz (108.8 kg)  03/17/21 252 lb (114.3 kg)  08/09/19 243 lb (110.2 kg)      Other studies Reviewed: Additional studies/ records that were reviewed today include: ***. Review of the above records demonstrates:  Please see elsewhere in the note.     ASSESSMENT AND PLAN:   CAD S/P percutaneous coronary angioplasty:  ***  The patient has no new sypmtoms.  No further cardiovascular testing is indicated.  We will continue with aggressive risk reduction and meds as listed.  Essential hypertension:  ***  The blood pressure is at target. No change in medications is indicated. We will continue with therapeutic  lifestyle changes (TLC).   Edema:  ***  I suspect this is venous insufficiency though I understand that there was not a diagnosis of this from her podiatrist per her report.  She is an active.  She keeps her feet down.  At this point I described to her how to keep her feet elevated and she is going to look into getting a Nutritional therapist.  I am going to change her from Lasix to Demadex giving her 60 mg in the morning and 40 mg in the afternoon.  She is going to take 40 mill equivalents of potassium.  She needs to come back in 1 week and get a basic metabolic profile and BNP.  I would also like to check an echocardiogram.     Current medicines are reviewed at length with the patient today.  The patient does not have concerns regarding medicines.  The following changes have been made:  ***  Labs/ tests ordered today include:   No orders of the defined types were placed in this encounter.     Disposition:   FU with ***   Signed, Rollene Rotunda, MD  08/04/2021 10:53 PM    [facility]

## 2021-08-06 ENCOUNTER — Ambulatory Visit: Payer: BLUE CROSS/BLUE SHIELD | Admitting: Cardiology

## 2021-08-06 DIAGNOSIS — R600 Localized edema: Secondary | ICD-10-CM

## 2021-08-06 DIAGNOSIS — I2510 Atherosclerotic heart disease of native coronary artery without angina pectoris: Secondary | ICD-10-CM

## 2021-08-06 DIAGNOSIS — I10 Essential (primary) hypertension: Secondary | ICD-10-CM

## 2021-08-10 ENCOUNTER — Telehealth: Payer: Self-pay | Admitting: Pharmacist

## 2021-08-10 NOTE — Telephone Encounter (Signed)
Called and spoke to pt to see if they would be willing to schedule pharmd visit to talk options for cholesterol and they stated they declined and they do not have the time and they refused injectables

## 2021-08-10 NOTE — Telephone Encounter (Signed)
Pt on list to follow up with once Leqvio became available. She currently takes rosuvastatin 40mg  daily and ezetimibe 10mg  daily, her LDL goal is < 55 due to premature ASCVD. Previously was not interested in Repatha. Would call again to see if she is willing to try Repatha now or if she would like office visit with PharmD to discuss. Would need updated lipids checked if so.  may be an option although unclear how well her insurance will cover, she would need to fill out enrollment form to verify first.

## 2021-08-18 ENCOUNTER — Other Ambulatory Visit: Payer: Self-pay | Admitting: Cardiology

## 2021-08-18 NOTE — Progress Notes (Signed)
Cardiology Office Note   Date:  08/20/2021   ID:  Latonyia, Lopata 02-Oct-1960, MRN [id number]  PCP:  Gordy Savers, MD (Inactive)  Cardiologist:   Rollene Rotunda, MD Referring:  Gordy Savers, MD (Inactive)  Chief Complaint  Patient presents with   Edema        History of Present Illness: [name] is a 60 y.o. female who presents for follow up of  coronary disease since her 30s.  She had her first heart attack at the age of 4 and it was treated with a stent to her left circumflex. In July 2006, she underwent her most recent catheterization for recurrent angina. This showed in-stent restenosis of the circumflex as well as 80% lesion in the LAD and first diagonal.  She underwent Cypher DES placement for in-stent restenosis of the left circumflex as well as to the LAD. Dr Antoine Poche saw her this past summer and she complained of chest pain and jaw pain.  Coronary CT was done July 2019.  However, because of previous stenting and her body habitus this  was not adequate to exclude obstructive coronary disease.    In February 2020 she complained of jaw pain and chest pain, one episode made her break out into a sweat.  She saw [facility] who convinced her to proceed with coronary angiogram. This was done 10/31/2018 and revealed patent LAD and CFX stents with a new pCFX 85% stenosis.  This was treated with PCI/ DES.  Her LVF was 55-65%.   Since I last saw her she saw Azalee Course PAc and she had decreased edema on Demadex.  She says her swelling is much better.  She is actually lost about 25 pounds between this and eating much better.  She thinks her breathing is better.  She is not having any PND or orthopnea.  She has been more physically active because of this.  She denies any chest pressure, neck or arm discomfort.  She has had no palpitations, presyncope or syncope.  Her biggest issue is burning in her feet particularly at night   Past Medical History:   Diagnosis Date   CAD (coronary artery disease)    cypher stent   GERD (gastroesophageal reflux disease)    Headache    Heart attack (HCC)    x 2   HLD (hyperlipidemia)    HTN (hypertension)    Humerus fracture    Obesity    Tinnitus     Past Surgical History:  Procedure Laterality Date   APPENDECTOMY     CARDIAC CATHETERIZATION     CORONARY STENT INTERVENTION N/A 10/31/2018   Procedure: CORONARY STENT INTERVENTION;  Surgeon: Marykay Lex, MD;  Location: [facility] INVASIVE CV LAB;  Service: Cardiovascular;  Laterality: N/A;   coronary stents     x 4   EYE SURGERY     lasik   FRACTURE SURGERY     left ankle   LEFT HEART CATH AND CORONARY ANGIOGRAPHY N/A 10/31/2018   Procedure: LEFT HEART CATH AND CORONARY ANGIOGRAPHY;  Surgeon: Marykay Lex, MD;  Location: [facility] INVASIVE CV LAB;  Service: Cardiovascular;  Laterality: N/A;   MULTIPLE TOOTH EXTRACTIONS     ORIF HUMERUS FRACTURE Right 04/01/2017   Procedure: OPEN REDUCTION INTERNAL FIXATION (ORIF) RIGHT PROXIMAL HUMERUS FRACTURE;  Surgeon: Tarry Kos, MD;  Location: [facility] OR;  Service: Orthopedics;  Laterality: Right;     Current Outpatient Medications  Medication Sig Dispense Refill  aspirin EC 81 MG EC tablet Take 1 tablet (81 mg total) by mouth daily.     clopidogrel (PLAVIX) 75 MG tablet TAKE ONE TABLET BY MOUTH DAILY 90 tablet 3   ezetimibe (ZETIA) 10 MG tablet TAKE ONE TABLET BY MOUTH DAILY 90 tablet 1   gabapentin (NEURONTIN) 100 MG capsule Take 1 capsule (100 mg total) by mouth 2 (two) times daily. 90 capsule 3   isosorbide mononitrate (IMDUR) 60 MG 24 hr tablet Take 1 tablet (60 mg total) by mouth daily. 90 tablet 0   metoprolol succinate (TOPROL-XL) 25 MG 24 hr tablet Take 1 tablet (25 mg total) by mouth daily. Take with or immediately following a meal. 90 tablet 3   pantoprazole (PROTONIX) 40 MG tablet TAKE ONE TABLET BY MOUTH DAILY 90 tablet 1   potassium chloride SA (KLOR-CON) 20 MEQ tablet Take 1 tablet (20 mEq  total) by mouth 2 (two) times daily. 180 tablet 3   ramipril (ALTACE) 2.5 MG capsule TAKE 1 CAPSULE BY MOUTH EVERY DAY 90 capsule 0   rosuvastatin (CRESTOR) 40 MG tablet TAKE ONE TABLET BY MOUTH DAILY 90 tablet 1   torsemide (DEMADEX) 20 MG tablet Take 60 mg in the morning (3 tablets) and 40 mg in the afternoon (2 tablets) 450 tablet 3   nitroGLYCERIN (NITROSTAT) 0.4 MG SL tablet Place 1 tablet (0.4 mg total) under the tongue every 5 (five) minutes as needed (chest pain). (Patient not taking: Reported on 05/05/2021) 25 tablet 3   No current facility-administered medications for this visit.    Allergies:   Iodine    ROS:  Please see the history of present illness.   Otherwise, review of systems are positive for none.   All other systems are reviewed and negative.    PHYSICAL EXAM: VS:  BP 106/74   Pulse 61   Ht 5\' 3"  (1.6 m)   Wt 231 lb 9.6 oz (105.1 kg)   SpO2 95%   BMI 41.03 kg/m  , BMI Body mass index is 41.03 kg/m. GENERAL:  Well appearing NECK:  No jugular venous distention, waveform within normal limits, carotid upstroke brisk and symmetric, no bruits, no thyromegaly LUNGS:  Clear to auscultation bilaterally CHEST:  Unremarkable HEART:  PMI not displaced or sustained,S1 and S2 within normal limits, no S3, no S4, no clicks, no rubs, no murmurs ABD:  Flat, positive bowel sounds normal in frequency in pitch, no bruits, no rebound, no guarding, no midline pulsatile mass, no hepatomegaly, no splenomegaly EXT:  2 plus pulses throughout, no edema, no cyanosis no clubbing   EKG:  EKG is  not ordered today.    Recent Labs: 05/01/2021: BUN 17; Creatinine, Ser 1.05; NT-Pro BNP 25; Potassium 4.0; Sodium 140    Lipid Panel    Component Value Date/Time   CHOL 182 01/27/2018 1629   TRIG 197 (H) 01/27/2018 1629   HDL 42 01/27/2018 1629   CHOLHDL 4.3 01/27/2018 1629   CHOLHDL 7 10/26/2013 1104   VLDL 53.6 (H) 10/26/2013 1104   LDLCALC 101 (H) 01/27/2018 1629   LDLDIRECT 192.8  10/26/2013 1104      Wt Readings from Last 3 Encounters:  08/20/21 231 lb 9.6 oz (105.1 kg)  05/05/21 239 lb 12.8 oz (108.8 kg)  03/17/21 252 lb (114.3 kg)      Other studies Reviewed: Additional studies/ records that were reviewed today include: None.   ASSESSMENT AND PLAN:   CAD S/P percutaneous coronary angioplasty:    She is having  no further chest discomfort.  We will continue with risk reduction.  Essential hypertension: Her blood pressure is well controlled.  No change in therapy.  Of note she is only been taking her metoprolol tartrate once daily and I will change this to succinate.   Edema: Her leg swelling is much improved.  I will check a basic metabolic profile today.  She uses her Demadex 3 pills in the morning and rarely takes the afternoon.  She is eating better and reducing her salt intake which I am sure helps.  Neuropathy: I will give her Neurontin 100 to 200 mg nightly.    Current medicines are reviewed at length with the patient today.  The patient does not have concerns regarding medicines.  The following changes have been made: As above  Labs/ tests ordered today include:   Orders Placed This Encounter  Procedures   Lipid panel   Comprehensive metabolic panel       Disposition:   FU with Azalee Course PA in 6 months.    Signed, Rollene Rotunda, MD  08/20/2021 12:18 PM     [facility]

## 2021-08-20 ENCOUNTER — Encounter: Payer: Self-pay | Admitting: Cardiology

## 2021-08-20 ENCOUNTER — Other Ambulatory Visit: Payer: Self-pay

## 2021-08-20 ENCOUNTER — Ambulatory Visit: Payer: BLUE CROSS/BLUE SHIELD | Admitting: Cardiology

## 2021-08-20 VITALS — BP 106/74 | HR 61 | Ht 63.0 in | Wt 231.6 lb

## 2021-08-20 DIAGNOSIS — R600 Localized edema: Secondary | ICD-10-CM | POA: Diagnosis not present

## 2021-08-20 DIAGNOSIS — I10 Essential (primary) hypertension: Secondary | ICD-10-CM | POA: Diagnosis not present

## 2021-08-20 DIAGNOSIS — Z79899 Other long term (current) drug therapy: Secondary | ICD-10-CM

## 2021-08-20 DIAGNOSIS — I2510 Atherosclerotic heart disease of native coronary artery without angina pectoris: Secondary | ICD-10-CM

## 2021-08-20 MED ORDER — GABAPENTIN 100 MG PO CAPS: 100.0000 mg | ORAL_CAPSULE | Freq: Two times a day (BID) | ORAL | 3 refills | Status: DC

## 2021-08-20 MED ORDER — METOPROLOL SUCCINATE ER 25 MG PO TB24: 25.0000 mg | ORAL_TABLET | Freq: Every day | ORAL | 3 refills | Status: DC

## 2021-08-20 NOTE — Patient Instructions (Signed)
Medication Instructions:  STOP: Metoprolol tartrate 25 mg twice a day  START: Metoprolol Succinate 25 mg daily               Gabapentin 100 mg twice a day  *If you need a refill on your cardiac medications before your next appointment, please call your pharmacy*   Lab Work: Your physician recommends lab work today (CMP, Lipid)    Testing/Procedures: None ordered today   Follow-Up: At [facility], you and your health needs are our priority.  As part of our continuing mission to provide you with exceptional heart care, we have created designated Provider Care Teams.  These Care Teams include your primary Cardiologist (physician) and Advanced Practice Providers (APPs -  Physician Assistants and Nurse Practitioners) who all work together to provide you with the care you need, when you need it.  We recommend signing up for the patient portal called "MyChart".  Sign up information is provided on this After Visit Summary.  MyChart is used to connect with patients for Virtual Visits (Telemedicine).  Patients are able to view lab/test results, encounter notes, upcoming appointments, etc.  Non-urgent messages can be sent to your provider as well.   To learn more about what you can do with MyChart, go to [email or web address].    Your next appointment:   6 month(s)  The format for your next appointment:   In Person  Provider:   Rollene Rotunda, MD

## 2021-08-21 LAB — COMPREHENSIVE METABOLIC PANEL
ALT: 20 IU/L (ref 0–32)
AST: 17 IU/L (ref 0–40)
Albumin/Globulin Ratio: 2.1 (ref 1.2–2.2)
Albumin: 4.4 g/dL (ref 3.8–4.9)
Alkaline Phosphatase: 105 IU/L (ref 44–121)
BUN/Creatinine Ratio: 15 (ref 9–23)
BUN: 19 mg/dL (ref 6–24)
Bilirubin Total: 0.7 mg/dL (ref 0.0–1.2)
CO2: 25 mmol/L (ref 20–29)
Calcium: 9.5 mg/dL (ref 8.7–10.2)
Chloride: 100 mmol/L (ref 96–106)
Creatinine, Ser: 1.3 mg/dL — ABNORMAL HIGH (ref 0.57–1.00)
Globulin, Total: 2.1 g/dL (ref 1.5–4.5)
Glucose: 83 mg/dL (ref 70–99)
Potassium: 3.6 mmol/L (ref 3.5–5.2)
Sodium: 143 mmol/L (ref 134–144)
Total Protein: 6.5 g/dL (ref 6.0–8.5)
eGFR: 47 mL/min/{1.73_m2} — ABNORMAL LOW (ref >59)

## 2021-08-21 LAB — LIPID PANEL
Chol/HDL Ratio: 4.3 ratio (ref 0.0–4.4)
Cholesterol, Total: 150 mg/dL (ref 100–199)
HDL: 35 mg/dL — ABNORMAL LOW (ref >39)
LDL Chol Calc (NIH): 83 mg/dL (ref 0–99)
Triglycerides: 188 mg/dL — ABNORMAL HIGH (ref 0–149)
VLDL Cholesterol Cal: 32 mg/dL (ref 5–40)

## 2021-12-24 ENCOUNTER — Other Ambulatory Visit: Payer: Self-pay | Admitting: Cardiology

## 2022-03-02 DIAGNOSIS — G629 Polyneuropathy, unspecified: Secondary | ICD-10-CM | POA: Insufficient documentation

## 2022-03-02 NOTE — Progress Notes (Unsigned)
Cardiology Office Note   Date:  03/03/2022   ID:  [name], DOB 04/13/1961, MRN [id number]  PCP:  Sallye Ober, PA-C  Cardiologist:   Rollene Rotunda, MD Referring:  Sallye Ober, PA-C  Chief Complaint  Patient presents with   Coronary Artery Disease        History of Present Illness: [name] is a 61 y.o. female who presents for follow up of  coronary disease since her 30s.  She had her first heart attack at the age of 61 and it was treated with a stent to her left circumflex. In July 2006, she underwent her most recent catheterization for recurrent angina. This showed in-stent restenosis of the circumflex as well as 80% lesion in the LAD and first diagonal.  She underwent Cypher DES placement for in-stent restenosis of the left circumflex as well as to the LAD. Dr Antoine Poche saw her this past summer and she complained of chest pain and jaw pain.  Coronary CT was done July 2019.  However, because of previous stenting and her body habitus this  was not adequate to exclude obstructive coronary disease.    In February 2020 she complained of jaw pain and chest pain, one episode made her break out into a sweat.  She saw [facility] who convinced her to proceed with coronary angiogram. This was done 10/31/2018 and revealed patent LAD and CFX stents with a new pCFX 85% stenosis.  This was treated with PCI/ DES.  Her LVF was 55-65%.   Since I last saw her she has had no new cardiovascular complaints.  She does not exercise as much as I would hope but she does walk the dog most days of the week.  Is about 25 pounds.  This has been since her peak. The patient denies any new symptoms such as chest discomfort, neck or arm discomfort. There has been no new shortness of breath, PND or orthopnea. There have been no reported palpitations, presyncope or syncope.   Past Medical History:  Diagnosis Date   CAD (coronary artery disease)    cypher stent   GERD  (gastroesophageal reflux disease)    Headache    Heart attack (HCC)    x 2   HLD (hyperlipidemia)    HTN (hypertension)    Humerus fracture    Obesity    Tinnitus     Past Surgical History:  Procedure Laterality Date   APPENDECTOMY     CARDIAC CATHETERIZATION     CORONARY STENT INTERVENTION N/A 10/31/2018   Procedure: CORONARY STENT INTERVENTION;  Surgeon: Marykay Lex, MD;  Location: [facility] INVASIVE CV LAB;  Service: Cardiovascular;  Laterality: N/A;   coronary stents     x 4   EYE SURGERY     lasik   FRACTURE SURGERY     left ankle   LEFT HEART CATH AND CORONARY ANGIOGRAPHY N/A 10/31/2018   Procedure: LEFT HEART CATH AND CORONARY ANGIOGRAPHY;  Surgeon: Marykay Lex, MD;  Location: [facility] INVASIVE CV LAB;  Service: Cardiovascular;  Laterality: N/A;   MULTIPLE TOOTH EXTRACTIONS     ORIF HUMERUS FRACTURE Right 04/01/2017   Procedure: OPEN REDUCTION INTERNAL FIXATION (ORIF) RIGHT PROXIMAL HUMERUS FRACTURE;  Surgeon: Tarry Kos, MD;  Location: [facility] OR;  Service: Orthopedics;  Laterality: Right;     Current Outpatient Medications  Medication Sig Dispense Refill   aspirin EC 81 MG EC tablet Take 1 tablet (81 mg total) by mouth daily.  clopidogrel (PLAVIX) 75 MG tablet TAKE ONE TABLET BY MOUTH DAILY 90 tablet 3   ezetimibe (ZETIA) 10 MG tablet TAKE ONE TABLET BY MOUTH DAILY 90 tablet 1   gabapentin (NEURONTIN) 100 MG capsule Take 1 capsule (100 mg total) by mouth 2 (two) times daily. 90 capsule 3   isosorbide mononitrate (IMDUR) 60 MG 24 hr tablet Take 1 tablet (60 mg total) by mouth daily. 90 tablet 0   nitroGLYCERIN (NITROSTAT) 0.4 MG SL tablet Place 1 tablet (0.4 mg total) under the tongue every 5 (five) minutes as needed (chest pain). 25 tablet 3   pantoprazole (PROTONIX) 40 MG tablet TAKE ONE TABLET BY MOUTH DAILY 90 tablet 1   potassium chloride SA (KLOR-CON) 20 MEQ tablet Take 1 tablet (20 mEq total) by mouth 2 (two) times daily. 180 tablet 3   ramipril (ALTACE) 2.5 MG  capsule TAKE 1 CAPSULE BY MOUTH EVERY DAY 90 capsule 0   rosuvastatin (CRESTOR) 40 MG tablet TAKE ONE TABLET BY MOUTH DAILY 90 tablet 1   torsemide (DEMADEX) 20 MG tablet Take 60 mg in the morning (3 tablets) and 40 mg in the afternoon (2 tablets) 450 tablet 3   metoprolol succinate (TOPROL-XL) 25 MG 24 hr tablet Take 1 tablet (25 mg total) by mouth daily. Take with or immediately following a meal. 90 tablet 3   No current facility-administered medications for this visit.    Allergies:   Iodine    ROS:  Please see the history of present illness.   Otherwise, review of systems are positive for neuropathy.   All other systems are reviewed and negative.    PHYSICAL EXAM: VS:  BP 105/60   Pulse 65   Ht 5\' 3"  (1.6 m)   Wt 225 lb 12.8 oz (102.4 kg)   SpO2 98%   BMI 40.00 kg/m  , BMI Body mass index is 40 kg/m. GENERAL:  Well appearing NECK:  No jugular venous distention, waveform within normal limits, carotid upstroke brisk and symmetric, no bruits, no thyromegaly LUNGS:  Clear to auscultation bilaterally CHEST:  Unremarkable HEART:  PMI not displaced or sustained,S1 and S2 within normal limits, no S3, no S4, no clicks, no rubs, no murmurs ABD:  Flat, positive bowel sounds normal in frequency in pitch, no bruits, no rebound, no guarding, no midline pulsatile mass, no hepatomegaly, no splenomegaly EXT:  2 plus pulses throughout, mild bilateral leg edema, no cyanosis no clubbin  EKG:  EKG is   ordered today. Sinus rhythm, rate 65, axis within normal limits, intervals within normal limits, no acute ST-T wave changes.   Recent Labs: 05/01/2021: NT-Pro BNP 25 08/20/2021: ALT 20; BUN 19; Creatinine, Ser 1.30; Potassium 3.6; Sodium 143    Lipid Panel    Component Value Date/Time   CHOL 150 08/20/2021 1227   TRIG 188 (H) 08/20/2021 1227   HDL 35 (L) 08/20/2021 1227   CHOLHDL 4.3 08/20/2021 1227   CHOLHDL 7 10/26/2013 1104   VLDL 53.6 (H) 10/26/2013 1104   LDLCALC 83 08/20/2021 1227    LDLDIRECT 192.8 10/26/2013 1104      Wt Readings from Last 3 Encounters:  03/03/22 225 lb 12.8 oz (102.4 kg)  08/20/21 231 lb 9.6 oz (105.1 kg)  05/05/21 239 lb 12.8 oz (108.8 kg)      Other studies Reviewed: Additional studies/ records that were reviewed today include:  Labs.   ASSESSMENT AND PLAN:   CAD S/P percutaneous coronary angioplasty:  . She is having no further chest  discomfort.  We will continue with risk reduction.  Essential hypertension: Her blood pressure is at target.  No change in therapy.     Edema: Her leg swelling is controlled with the meds as listed.  No change in therapy.    Neuropathy: She uses nightly Neurontin.  Have also suggested possibly trying benfotiamine.   Tobacco abuse: She is unable to quit smoking and we talked about this repeatedly.  Current medicines are reviewed at length with the patient today.  The patient does not have concerns regarding medicines.  The following changes have been made: None  Labs/ tests ordered today include: None  Orders Placed This Encounter  Procedures   EKG 12-Lead     Disposition:   FU with me in one year.    Signed, Rollene Rotunda, MD  03/03/2022 4:32 PM    [facility]

## 2022-03-03 ENCOUNTER — Ambulatory Visit: Payer: BLUE CROSS/BLUE SHIELD | Admitting: Cardiology

## 2022-03-03 ENCOUNTER — Encounter: Payer: Self-pay | Admitting: Cardiology

## 2022-03-03 VITALS — BP 105/60 | HR 65 | Ht 63.0 in | Wt 225.8 lb

## 2022-03-03 DIAGNOSIS — I10 Essential (primary) hypertension: Secondary | ICD-10-CM | POA: Diagnosis not present

## 2022-03-03 DIAGNOSIS — I2510 Atherosclerotic heart disease of native coronary artery without angina pectoris: Secondary | ICD-10-CM | POA: Diagnosis not present

## 2022-03-03 DIAGNOSIS — R600 Localized edema: Secondary | ICD-10-CM | POA: Diagnosis not present

## 2022-03-03 DIAGNOSIS — G629 Polyneuropathy, unspecified: Secondary | ICD-10-CM

## 2022-03-03 NOTE — Patient Instructions (Signed)
Medication Instructions:   BENFOTAMINE  *If you need a refill on your cardiac medications before your next appointment, please call your pharmacy*   Follow-Up: At [facility], you and your health needs are our priority.  As part of our continuing mission to provide you with exceptional heart care, we have created designated Provider Care Teams.  These Care Teams include your primary Cardiologist (physician) and Advanced Practice Providers (APPs -  Physician Assistants and Nurse Practitioners) who all work together to provide you with the care you need, when you need it.  We recommend signing up for the patient portal called "MyChart".  Sign up information is provided on this After Visit Summary.  MyChart is used to connect with patients for Virtual Visits (Telemedicine).  Patients are able to view lab/test results, encounter notes, upcoming appointments, etc.  Non-urgent messages can be sent to your provider as well.   To learn more about what you can do with MyChart, go to [email or web address].    Your next appointment:   12 month(s)  The format for your next appointment:   In Person  Provider:   Rollene Rotunda, MD       Important Information About Sugar

## 2022-03-05 ENCOUNTER — Other Ambulatory Visit: Payer: Self-pay | Admitting: Cardiology

## 2022-03-16 ENCOUNTER — Other Ambulatory Visit: Payer: Self-pay | Admitting: Cardiology

## 2022-03-24 ENCOUNTER — Other Ambulatory Visit: Payer: Self-pay | Admitting: Cardiology

## 2022-04-02 ENCOUNTER — Other Ambulatory Visit: Payer: Self-pay | Admitting: Cardiology

## 2022-04-02 DIAGNOSIS — R0602 Shortness of breath: Secondary | ICD-10-CM

## 2022-06-22 ENCOUNTER — Other Ambulatory Visit: Payer: Self-pay | Admitting: Cardiology

## 2022-08-25 ENCOUNTER — Other Ambulatory Visit: Payer: Self-pay | Admitting: Cardiology

## 2022-09-27 ENCOUNTER — Other Ambulatory Visit: Payer: Self-pay | Admitting: Cardiology

## 2022-10-28 ENCOUNTER — Other Ambulatory Visit: Payer: Self-pay | Admitting: Cardiology

## 2022-11-01 ENCOUNTER — Other Ambulatory Visit: Payer: Self-pay | Admitting: Cardiology

## 2022-11-01 DIAGNOSIS — R0602 Shortness of breath: Secondary | ICD-10-CM

## 2023-03-19 ENCOUNTER — Other Ambulatory Visit: Payer: Self-pay | Admitting: Cardiology

## 2023-04-22 ENCOUNTER — Other Ambulatory Visit: Payer: Self-pay | Admitting: Cardiology

## 2023-04-22 ENCOUNTER — Telehealth: Payer: Self-pay | Admitting: Cardiology

## 2023-04-22 NOTE — Telephone Encounter (Signed)
*  STAT* If patient is at the pharmacy, call can be transferred to refill team.   1. Which medications need to be refilled? (please list name of each medication and dose if known)   gabapentin (NEURONTIN) 100 MG capsule    2. Would you like to learn more about the convenience, safety, & potential cost savings by using the [facility]?   3. Are you open to using the [facility] (Type [facility]. ).  4. Which pharmacy/location (including street and city if local pharmacy) is medication to be sent to?  [facility] [id number] - [place], [place] - [address]   5. Do they need a 30 day or 90 day supply?   90 day  Patient stated she is almost out of this medication and will be leaving to go on vacation.  Patient has appointment scheduled on 10/30.

## 2023-07-17 DIAGNOSIS — M7989 Other specified soft tissue disorders: Secondary | ICD-10-CM | POA: Insufficient documentation

## 2023-07-17 NOTE — Progress Notes (Unsigned)
Cardiology Office Note:   Date:  07/20/2023  ID:  [name], DOB [birth year], MRN [id number] PCP: Ivor Reining  [facility] Providers Cardiologist:  Rollene Rotunda, MD {  History of Present Illness:   [name] is a 62 y.o. female  who presents for follow up of  coronary disease since her 30s.  She had her first heart attack at the age of 29 and it was treated with a stent to her left circumflex. In July 2006, she underwent her most recent catheterization for recurrent angina. This showed in-stent restenosis of the circumflex as well as 80% lesion in the LAD and first diagonal.  She underwent Cypher DES placement for in-stent restenosis of the left circumflex as well as to the LAD. Dr Antoine Poche saw her this past summer and she complained of chest pain and jaw pain.  Coronary CT was done July 2019.  However, because of previous stenting and her body habitus this  was not adequate to exclude obstructive coronary disease. In February 2020 she complained of jaw pain and chest pain, one episode made her break out into a sweat.  She saw [facility] who convinced her to proceed with coronary angiogram. This was done 10/31/2018 and revealed patent LAD and CFX stents with a new pCFX 85% stenosis.  This was treated with PCI/ DES.  Her LVF was 55-65%.    Since I last saw her she has had pain in her legs.  She feels neuropathic burning at night and she takes Neurontin for this.  She cannot take that during the day because it makes her sleepy.  She says it does not really help with the neuropathy.  She has other leg discomfort as well.  She has a pain in her right buttocks.  She is not describing calf discomfort.  It does not happen when she is exerting herself.  She is not getting any chest pressure, neck or arm discomfort.  She has no new shortness of breath, PND or orthopnea.  She has no weight gain or edema.  ROS: As stated in the HPI and negative for all other  systems.  Studies Reviewed:    EKG:   EKG Interpretation Date/Time:  Wednesday July 20 2023 16:09:20 EDT Ventricular Rate:  65 PR Interval:  160 QRS Duration:  86 QT Interval:  418 QTC Calculation: 434 R Axis:   59  Text Interpretation: Normal sinus rhythm When compared with ECG of 01-Nov-2018 07:12, No significant change was found Confirmed by Rollene Rotunda ([id number]) on 07/20/2023 4:36:38 PM     Risk Assessment/Calculations:              Physical Exam:   VS:  BP 98/68   Pulse 65   Ht 5\' 3"  (1.6 m)   Wt 229 lb 12.8 oz (104.2 kg)   SpO2 95%   BMI 40.71 kg/m    Wt Readings from Last 3 Encounters:  07/20/23 229 lb 12.8 oz (104.2 kg)  03/03/22 225 lb 12.8 oz (102.4 kg)  08/20/21 231 lb 9.6 oz (105.1 kg)     GEN: Well nourished, well developed in no acute distress NECK: No JVD; No carotid bruits CARDIAC: RRR, no murmurs, rubs, gallops RESPIRATORY:  Clear to auscultation without rales, wheezing or rhonchi  ABDOMEN: Soft, non-tender, non-distended EXTREMITIES:  No edema; No deformity   ASSESSMENT AND PLAN:   CAD S/P percutaneous coronary angioplasty:   The patient has no new sypmtoms.  No further cardiovascular  testing is indicated.  We will continue with aggressive risk reduction and meds as listed.  Essential hypertension: Her blood pressure is at target.  No change in therapy.    Leg pain: I am going to check ABIs.  I suspect however this is neuropathic pain.    Neuropathy:    I have suggested suggested possibly trying benfotiamine.    Tobacco abuse: She is unable to quit smoking we talked about this again today.  Dyslipidemia: I would like to check a fasting lipid profile.  Goal should be an LDL in the 50s.        Follow up with me in one year.   Signed, Rollene Rotunda, MD

## 2023-07-18 ENCOUNTER — Other Ambulatory Visit: Payer: Self-pay | Admitting: Cardiology

## 2023-07-20 ENCOUNTER — Ambulatory Visit: Payer: BLUE CROSS/BLUE SHIELD | Attending: Cardiology | Admitting: Cardiology

## 2023-07-20 ENCOUNTER — Encounter: Payer: Self-pay | Admitting: Cardiology

## 2023-07-20 VITALS — BP 98/68 | HR 65 | Ht 63.0 in | Wt 229.8 lb

## 2023-07-20 DIAGNOSIS — M7989 Other specified soft tissue disorders: Secondary | ICD-10-CM

## 2023-07-20 DIAGNOSIS — Z720 Tobacco use: Secondary | ICD-10-CM | POA: Diagnosis not present

## 2023-07-20 DIAGNOSIS — I2510 Atherosclerotic heart disease of native coronary artery without angina pectoris: Secondary | ICD-10-CM

## 2023-07-20 DIAGNOSIS — I10 Essential (primary) hypertension: Secondary | ICD-10-CM

## 2023-07-20 NOTE — Patient Instructions (Signed)
Medication Instructions:  Continue same medications *If you need a refill on your cardiac medications before your next appointment, please call your pharmacy*   Lab Work: Lipid panel   Testing/Procedures: Schedule ABI's    Follow-Up: At [facility], you and your health needs are our priority.  As part of our continuing mission to provide you with exceptional heart care, we have created designated Provider Care Teams.  These Care Teams include your primary Cardiologist (physician) and Advanced Practice Providers (APPs -  Physician Assistants and Nurse Practitioners) who all work together to provide you with the care you need, when you need it.  We recommend signing up for the patient portal called "MyChart".  Sign up information is provided on this After Visit Summary.  MyChart is used to connect with patients for Virtual Visits (Telemedicine).  Patients are able to view lab/test results, encounter notes, upcoming appointments, etc.  Non-urgent messages can be sent to your provider as well.   To learn more about what you can do with MyChart, go to [email or web address].    Your next appointment:  1 year    Call in June to schedule Oct appointment     Provider:  Dr.Hochrein

## 2023-08-05 ENCOUNTER — Other Ambulatory Visit: Payer: Self-pay | Admitting: Cardiology

## 2023-08-05 ENCOUNTER — Ambulatory Visit (HOSPITAL_COMMUNITY)
Admission: RE | Admit: 2023-08-05 | Discharge: 2023-08-05 | Disposition: A | Discharge status: Home / Self Care | Payer: BLUE CROSS/BLUE SHIELD | Source: Ambulatory Visit | Attending: Internal Medicine | Admitting: Internal Medicine

## 2023-08-05 DIAGNOSIS — I2510 Atherosclerotic heart disease of native coronary artery without angina pectoris: Secondary | ICD-10-CM

## 2023-08-05 DIAGNOSIS — M7989 Other specified soft tissue disorders: Secondary | ICD-10-CM

## 2023-08-05 DIAGNOSIS — I10 Essential (primary) hypertension: Secondary | ICD-10-CM

## 2023-08-05 DIAGNOSIS — Z720 Tobacco use: Secondary | ICD-10-CM

## 2023-08-05 LAB — VAS US ABI WITH/WO TBI
Left ABI: 1.18
Right ABI: 1.19

## 2023-08-06 LAB — LIPID PANEL
Chol/HDL Ratio: 3.1 ratio (ref 0.0–4.4)
Cholesterol, Total: 141 mg/dL (ref 100–199)
HDL: 45 mg/dL (ref >39)
LDL Chol Calc (NIH): 71 mg/dL (ref 0–99)
Triglycerides: 144 mg/dL (ref 0–149)
VLDL Cholesterol Cal: 25 mg/dL (ref 5–40)

## 2023-08-08 ENCOUNTER — Other Ambulatory Visit: Payer: Self-pay | Admitting: *Deleted

## 2023-08-08 DIAGNOSIS — I2510 Atherosclerotic heart disease of native coronary artery without angina pectoris: Secondary | ICD-10-CM

## 2023-08-11 ENCOUNTER — Encounter: Payer: Self-pay | Admitting: *Deleted

## 2023-08-16 ENCOUNTER — Other Ambulatory Visit: Payer: Self-pay | Admitting: Cardiology

## 2023-08-17 ENCOUNTER — Other Ambulatory Visit: Payer: Self-pay

## 2023-09-07 ENCOUNTER — Other Ambulatory Visit: Payer: Self-pay | Admitting: Cardiology

## 2023-09-15 ENCOUNTER — Encounter: Payer: Self-pay | Admitting: Pharmacist Clinician (PhC)/ Clinical Pharmacy Specialist

## 2023-09-15 ENCOUNTER — Ambulatory Visit
Payer: BLUE CROSS/BLUE SHIELD | Attending: Cardiology | Admitting: Pharmacist Clinician (PhC)/ Clinical Pharmacy Specialist

## 2023-09-15 DIAGNOSIS — E785 Hyperlipidemia, unspecified: Secondary | ICD-10-CM

## 2023-09-15 DIAGNOSIS — Z1389 Encounter for screening for other disorder: Secondary | ICD-10-CM | POA: Diagnosis not present

## 2023-09-15 MED ORDER — NEXLETOL 180 MG PO TABS: 180.0000 mg | ORAL_TABLET | Freq: Every day | ORAL | Status: DC

## 2023-09-15 NOTE — Assessment & Plan Note (Signed)
Patient was asking about Reginal Lutes use to help with weight management.  Hopes that weight loss will further decrease risk of MI, as she had her first at age 62.  Notes that her insurance often changes on Jan 1, so have asked that she message [place] after then with updated insurance information.   Reviewed mechanism of medication and dosing titrations.  (Her husband is taking this now).  Will see if covered on her plan for CV reasons.

## 2023-09-15 NOTE — Progress Notes (Signed)
Office Visit    Patient Name: [name] Date of Encounter: 09/15/2023  Primary Care Provider:  Sallye Ober, PA-C Primary Cardiologist:  Rollene Rotunda, MD  Chief Complaint    Hyperlipidemia   Significant Past Medical History   CAD First MI at 67, stent to LCx,  2006 DES to restenosed LCx, DEs to LAD; 2020 DES to pLCx, on ASA, clopidogrel  HTN Controlled, on ramipril 2.5 mg, metoprolol 25 mg     Allergies  Allergen Reactions   Iodine Swelling and Rash    SWELLING REACTION UNSPECIFIED       History of Present Illness    [name] is a 62 y.o. female patient of Dr Antoine Poche, in the office today to discuss options for cholesterol management.  Ms Lisenbee had her first MI at the age of 55 and notes a strong family history, with father dying at 40 after multiple MI.   Currently taking high intensity statin as well as ezetimibe, with this combination LDL at 71, with goal of < 55.    Insurance Carrier: Pharmacologist  LDL Cholesterol goal:  LDL< 55  Current Medications:   rosuvastatin 40 mg every day, ezetimibe 10 mg every day   Family Hx:  father had 8 MI starting at 33, died at 25; ; mother 5 aortic aneurysms, died at 61; brother died in MVA at 52; p uncle died Mi, 2 cousins with strokes, 1 with multiple MI, no children  Social Hx: Tobacco: 1 ppd Alcohol: rare    Diet:  mix of home and occasionally out.  Husband does most of the cooked; regular proteins, mix of vegetables, doesn't snack much; no sweet tooth (prefers salty)   Exercise: none, broke both feet at once few years back and has nerve damage   Accessory Clinical Findings   Lab Results  Component Value Date   CHOL 141 08/05/2023   HDL 45 08/05/2023   LDLCALC 71 08/05/2023   LDLDIRECT 192.8 10/26/2013   TRIG 144 08/05/2023   CHOLHDL 3.1 08/05/2023    No results found for: "LIPOA"  Lab Results  Component Value Date   ALT 20 08/20/2021   AST 17 08/20/2021   ALKPHOS 105 08/20/2021    BILITOT 0.7 08/20/2021   Lab Results  Component Value Date   CREATININE 1.30 (H) 08/20/2021   BUN 19 08/20/2021   NA 143 08/20/2021   K 3.6 08/20/2021   CL 100 08/20/2021   CO2 25 08/20/2021   Lab Results  Component Value Date   HGBA1C 5.7 11/14/2009    Home Medications    Current Outpatient Medications  Medication Sig Dispense Refill   Bempedoic Acid (NEXLETOL) 180 MG TABS Take 1 tablet (180 mg total) by mouth daily.     gabapentin (NEURONTIN) 100 MG capsule Take 100 mg by mouth 2 (two) times daily. Needs appointment for further refills     aspirin EC 81 MG EC tablet Take 1 tablet (81 mg total) by mouth daily.     clopidogrel (PLAVIX) 75 MG tablet TAKE 1 TABLET BY MOUTH DAILY 90 tablet 3   ezetimibe (ZETIA) 10 MG tablet TAKE 1 TABLET BY MOUTH DAILY 90 tablet 3   isosorbide mononitrate (IMDUR) 60 MG 24 hr tablet TAKE ONE TABLET BY MOUTH DAILY 90 tablet 0   metoprolol succinate (TOPROL-XL) 25 MG 24 hr tablet TAKE 1 TABLET BY MOUTH DAILY WITH OR IMMEDIATELY FOLLOWING A MEAL 90 tablet 3   nitroGLYCERIN (NITROSTAT) 0.4 MG SL  tablet Place 1 tablet (0.4 mg total) under the tongue every 5 (five) minutes as needed (chest pain). 25 tablet 3   pantoprazole (PROTONIX) 40 MG tablet TAKE 1 TABLET BY MOUTH DAILY 90 tablet 3   potassium chloride SA (KLOR-CON M20) 20 MEQ tablet TAKE ONE TABLET BY MOUTH TWICE A DAY 180 tablet 1   ramipril (ALTACE) 2.5 MG capsule TAKE 1 CAPSULE BY MOUTH DAILY 90 capsule 3   rosuvastatin (CRESTOR) 40 MG tablet TAKE 1 TABLET BY MOUTH DAILY 90 tablet 3   torsemide (DEMADEX) 20 MG tablet TAKE THREE TABLETS (60MG ) BY MOUTH EVERY MORNING AND TAKE TWO TABLETS (40MG ) BY MOUTH EVERY AFTERNOON 450 tablet 3   No current facility-administered medications for this visit.     Assessment & Plan    Dyslipidemia, goal LDL below 70 Assessment: Patient with ASCVD not at LDL goal of < 55 Most recent LDL 71 on 08/05/23 Has been compliant with high intensity statin/ezetimibe :  rosuvastatin 40 mg and ezetimibe 10 mg Reviewed options for lowering LDL cholesterol, including ezetimibe, PCSK-9 inhibitors, bempedoic acid and inclisiran.  Discussed mechanisms of action, dosing, side effects, potential decreases in LDL cholesterol and costs.  Also reviewed potential options for patient assistance.  Plan: Patient agreeable to starting bempedoic acid 180 mg daily Repeat labs after:  2-3 weeks Uric acid If stable, start Nexlizet 180/10 mg once daily and d/c ezetimibe 10 mg tablet. Repeat labs in 2-3 months Lipid Liver function If approved by insurance, will get copay card to patient to help with cost.     Morbid obesity [facility]) Patient was asking about Wegovy use to help with weight management.  Hopes that weight loss will further decrease risk of MI, as she had her first at age 24.  Notes that her insurance often changes on Jan 1, so have asked that she message [place] after then with updated insurance information.   Reviewed mechanism of medication and dosing titrations.  (Her husband is taking this now).  Will see if covered on her plan for CV reasons.     Phillips Hay, PharmD CPP [facility] [address]  [place], [place] [address] [phone]  09/15/2023, 2:45 PM

## 2023-09-15 NOTE — Assessment & Plan Note (Signed)
Assessment: Patient with ASCVD not at LDL goal of < 55 Most recent LDL 71 on 08/05/23 Has been compliant with high intensity statin/ezetimibe : rosuvastatin 40 mg and ezetimibe 10 mg Reviewed options for lowering LDL cholesterol, including ezetimibe, PCSK-9 inhibitors, bempedoic acid and inclisiran.  Discussed mechanisms of action, dosing, side effects, potential decreases in LDL cholesterol and costs.  Also reviewed potential options for patient assistance.  Plan: Patient agreeable to starting bempedoic acid 180 mg daily Repeat labs after:  2-3 weeks Uric acid If stable, start Nexlizet 180/10 mg once daily and d/c ezetimibe 10 mg tablet. Repeat labs in 2-3 months Lipid Liver function If approved by insurance, will get copay card to patient to help with cost.

## 2023-09-15 NOTE — Patient Instructions (Addendum)
Your Results:             Your most recent labs Goal  Total Cholesterol  < 200  Triglycerides  < 150  HDL (happy/good cholesterol)  > 40  LDL (lousy/bad cholesterol  < 55   Medication changes:  Start Nexletol 180 mg once daily.  After 2 weeks go to the lab to get uric acid level checked.  If this is okay we will continue with Nexlezet 1 tablet daily   (Nexlezet = Nexletol + ezetimibe)  Please send me a My Chart message after Jan 1 with your updated insurance information.   We will then run a test claim to see if Reginal Lutes will be covered on your plan.   Thank you for choosing [facility]

## 2023-09-30 MED ORDER — NEXLETOL 180 MG PO TABS: 1.0000 | ORAL_TABLET | Freq: Every day | ORAL | 0 refills | Status: DC

## 2023-09-30 MED ORDER — NEXLETOL 180 MG PO TABS: 1.0000 | ORAL_TABLET | Freq: Every day | ORAL | Status: DC

## 2023-10-06 ENCOUNTER — Telehealth: Payer: Self-pay | Admitting: Cardiology

## 2023-10-06 NOTE — Telephone Encounter (Signed)
Called patient with no answer. Unable to leave message mailbox fox.

## 2023-10-06 NOTE — Telephone Encounter (Signed)
Pt would like to know if an order for full lab work can be put in being that she had not had one in a while. Pt would like to be c/b regarding this matter, so that she can have labs done on Monday if possible. Please advise

## 2023-10-07 ENCOUNTER — Telehealth: Payer: Self-pay | Admitting: Pharmacy Technician

## 2023-10-07 ENCOUNTER — Other Ambulatory Visit (HOSPITAL_COMMUNITY): Payer: Self-pay

## 2023-10-07 DIAGNOSIS — E785 Hyperlipidemia, unspecified: Secondary | ICD-10-CM

## 2023-10-07 DIAGNOSIS — I2510 Atherosclerotic heart disease of native coronary artery without angina pectoris: Secondary | ICD-10-CM

## 2023-10-07 MED ORDER — NEXLETOL 180 MG PO TABS: 1.0000 | ORAL_TABLET | Freq: Every day | ORAL | 5 refills | Status: DC

## 2023-10-07 NOTE — Telephone Encounter (Signed)
PA request has been Approved. New Encounter created for follow up. For additional info see Pharmacy Prior Auth telephone encounter from 10/07/23.

## 2023-10-07 NOTE — Telephone Encounter (Addendum)
 2nd attempt to call patient, no answer mailbox full unable to leave message.

## 2023-10-07 NOTE — Telephone Encounter (Signed)
Pharmacy Patient Advocate Encounter   Received notification from Pt Calls Messages that prior authorization for Nexletol 180MG  tablets is required/requested.   Insurance verification completed.   The patient is insured through [employer] .   Per test claim: PA required; PA submitted to above mentioned insurance via CoverMyMeds Key/confirmation #/EOC [id number] Status is pending

## 2023-10-07 NOTE — Addendum Note (Signed)
Addended by: Cheree Ditto on: 10/07/2023 04:07 PM   Modules accepted: Orders

## 2023-10-07 NOTE — Telephone Encounter (Signed)
Patient identification verified by 2 forms. Marilynn Rail, RN    Received call from patient  Patient states:   -has to present to lab on Monday   -she is on Torsemide, would like labs to check kidneys  -she is out of Nexletol samples, unsure if she can get a Rx sent  Informed patient message sent to pharmacy and Dr. Antoine Poche  Patient has no further questions at this time

## 2023-10-07 NOTE — Telephone Encounter (Signed)
Pharmacy Patient Advocate Encounter  Received notification from [facility] that Prior Authorization for Nexletol 180MG  tablets  has been APPROVED from 09/07/23 to 10/06/24. Ran test claim, Copay is $0.00. This test claim was processed through [facility]- copay amounts may vary at other pharmacies due to pharmacy/plan contracts, or as the patient moves through the different stages of their insurance plan.   PA #/Case ID/Reference #: [id number]

## 2023-10-13 ENCOUNTER — Telehealth: Payer: Self-pay | Admitting: Cardiology

## 2023-10-13 DIAGNOSIS — R0602 Shortness of breath: Secondary | ICD-10-CM

## 2023-10-13 MED ORDER — TORSEMIDE 20 MG PO TABS: ORAL_TABLET | ORAL | 2 refills | Status: DC

## 2023-10-13 NOTE — Telephone Encounter (Signed)
Pt's medication was sent to pt's pharmacy as requested. Confirmation received.  °

## 2023-10-13 NOTE — Telephone Encounter (Signed)
*  STAT* If patient is at the pharmacy, call can be transferred to refill team.   1. Which medications need to be refilled? (please list name of each medication and dose if known) gabapentin (NEURONTIN) 100 MG capsule   2. Which pharmacy/location (including street and city if local pharmacy) is medication to be sent to?  [id number] [place], [place] - [address]      3. Do they need a 30 day or 90 day supply? 90 day

## 2023-10-13 NOTE — Telephone Encounter (Signed)
*  STAT* If patient is at the pharmacy, call can be transferred to refill team.   1. Which medications need to be refilled? (please list name of each medication and dose if known) torsemide (DEMADEX) 20 MG tablet    2. Would you like to learn more about the convenience, safety, & potential cost savings by using the [facility]?     3. Are you open to using the [facility] (Type [facility]. ).   4. Which pharmacy/location (including street and city if local pharmacy) is medication to be sent to? [id number] [place], [place] - [address]     5. Do they need a 30 day or 90 day supply? 90

## 2023-10-13 NOTE — Telephone Encounter (Signed)
Pt is requesting a refill on gabapentin. Would Dr. Antoine Poche like to refill this medication? Please address

## 2023-10-13 NOTE — Telephone Encounter (Signed)
Called pt to inform her that she needed to contact PCP for a refill on her medication gabapentin and pt insist that Dr. Antoine Poche calls her because pt stated that he always refills this non cardiac medication. Pt waiting for a call back. Please address

## 2023-10-18 ENCOUNTER — Other Ambulatory Visit: Payer: Self-pay

## 2023-10-18 MED ORDER — GABAPENTIN 100 MG PO CAPS: 100.0000 mg | ORAL_CAPSULE | Freq: Two times a day (BID) | ORAL | 3 refills | Status: AC

## 2023-10-21 ENCOUNTER — Other Ambulatory Visit: Payer: Self-pay | Admitting: Cardiology

## 2023-11-07 ENCOUNTER — Telehealth: Payer: Self-pay | Admitting: Pharmacist

## 2023-11-07 DIAGNOSIS — E785 Hyperlipidemia, unspecified: Secondary | ICD-10-CM

## 2023-11-07 DIAGNOSIS — I2510 Atherosclerotic heart disease of native coronary artery without angina pectoris: Secondary | ICD-10-CM

## 2023-11-07 DIAGNOSIS — Z1389 Encounter for screening for other disorder: Secondary | ICD-10-CM

## 2023-11-07 LAB — HEPATIC FUNCTION PANEL
ALT: 35 [IU]/L — ABNORMAL HIGH (ref 0–32)
AST: 29 [IU]/L (ref 0–40)
Albumin: 4.3 g/dL (ref 3.9–4.9)
Alkaline Phosphatase: 89 [IU]/L (ref 44–121)
Bilirubin Total: 0.4 mg/dL (ref 0.0–1.2)
Bilirubin, Direct: 0.19 mg/dL (ref 0.00–0.40)
Total Protein: 6.4 g/dL (ref 6.0–8.5)

## 2023-11-07 LAB — LIPID PANEL
Chol/HDL Ratio: 3.3 {ratio} (ref 0.0–4.4)
Cholesterol, Total: 117 mg/dL (ref 100–199)
HDL: 35 mg/dL — ABNORMAL LOW (ref >39)
LDL Chol Calc (NIH): 49 mg/dL (ref 0–99)
Triglycerides: 202 mg/dL — ABNORMAL HIGH (ref 0–149)
VLDL Cholesterol Cal: 33 mg/dL (ref 5–40)

## 2023-11-07 LAB — URIC ACID: Uric Acid: 5.5 mg/dL (ref 3.0–7.2)

## 2023-11-07 NOTE — Telephone Encounter (Signed)
Patient here for fasting lipid panel, LFTs, and uric acid

## 2023-11-11 ENCOUNTER — Encounter: Payer: Self-pay | Admitting: Pharmacist Clinician (PhC)/ Clinical Pharmacy Specialist

## 2024-01-09 ENCOUNTER — Other Ambulatory Visit: Payer: Self-pay | Admitting: Cardiology

## 2024-01-09 DIAGNOSIS — R0602 Shortness of breath: Secondary | ICD-10-CM

## 2024-01-20 ENCOUNTER — Telehealth: Payer: Self-pay | Admitting: Cardiology

## 2024-01-20 NOTE — Telephone Encounter (Signed)
 Patient calling in about to discuss about getting on ozempic. Please advise

## 2024-01-20 NOTE — Telephone Encounter (Signed)
 Patient would like to start ozempic. She feels this would be a good fit for her and her heart health. She states she has checked with her insurance and ozempic is covered.  Will forward to Dr. Lavonne Prairie to review and advise.

## 2024-02-19 ENCOUNTER — Encounter: Payer: Self-pay | Admitting: Cardiology

## 2024-02-20 ENCOUNTER — Other Ambulatory Visit: Payer: Self-pay | Admitting: Cardiology

## 2024-02-20 NOTE — Telephone Encounter (Signed)
 Please review and advise.

## 2024-02-20 NOTE — Telephone Encounter (Signed)
**Note De-identified  [place] Obfuscation** Please advise 

## 2024-04-05 ENCOUNTER — Other Ambulatory Visit: Payer: Self-pay | Admitting: Cardiology

## 2024-06-19 ENCOUNTER — Other Ambulatory Visit (HOSPITAL_COMMUNITY): Payer: Self-pay

## 2024-06-19 ENCOUNTER — Telehealth: Payer: Self-pay | Admitting: Pharmacy Technician

## 2024-06-19 ENCOUNTER — Encounter: Payer: Self-pay | Admitting: Cardiology

## 2024-06-19 NOTE — Telephone Encounter (Signed)
 [name]

## 2024-07-02 ENCOUNTER — Other Ambulatory Visit (HOSPITAL_COMMUNITY): Payer: Self-pay

## 2024-07-02 ENCOUNTER — Ambulatory Visit: Attending: Cardiology

## 2024-07-02 ENCOUNTER — Telehealth: Payer: Self-pay | Admitting: Pharmacy Technician

## 2024-07-02 MED ORDER — TIRZEPATIDE-WEIGHT MANAGEMENT 2.5 MG/0.5ML ~~LOC~~ SOLN: 2.5000 mg | SUBCUTANEOUS | 0 refills | Status: DC

## 2024-07-02 NOTE — Progress Notes (Signed)
 Patient ID: [name]                 DOB: [birth year]                    MRN: [id number]     HPI: [name] is a 63 y.o. female patient referred to pharmacy clinic by Dr. Edison to initiate GLP1-RA therapy. PMH is significant for CAD s/p percurtaneous coronary angioplasty, HTN, HLD, GERD, hx of heart attack, and obesity. Most recent BMI 40.21 kg/m.  Patient presents to PharmD clinic to initiate GLP-1 therapy. Patient is interested in trying GLP-1 RA to aid in weight loss. Patient denies any history of OSA, prediabetes or diabetes. She reports her finger sticks are around 90 - 95 so unlikely A1c > 6.5. Our team has already assessed coverage of Wegovy and Zepbound and both are plan/benefit exclusion.   Of note, patient recently tried her husband's Wegovy 2.4 mg dose and experienced significant ADRs including nausea and vomiting. Subsequently, she took a single dose of her husband's Wegovy 0.25 mg, which she felt was less effective than the 2.4 mg dose. She states that she has lost approximately 6 pounds after taking one 2.4 mg dose and one 0.25 mg dose. The patient has a history of MI and expresses a strong desire to initiate GLP-1 RA through her healthcare team. She has read up on GLP-1 RA therapy and is aware of CV benefit with [facility] but would like to try Zepbound due to greater weight loss effect.   Current weight and BMI: 227 and 40.21 kg/m  Goal weight: 180 lbs Current meds that affect weight: gabapentin   Diet:  Diet has been variable lately. She reports that after taking two doses of Wegovy she hasn't had much of an appetite. She admits her diet could improve. She doesn't have a strong preference for sweets but could incorporate more lean protein and vegetables.   Exercise:  No structured exercise at this time Range of motion is limited due to rod in right arm but she is able to perform lower body resistance exercises and cardio   Family History:  Relation Problem  Comments  Mother (Deceased) Aortic aneurysm died of complication after surgery  Heart attack     Father (Deceased at age 32) Heart attack (Age: 23) died at age 68    Brother (Deceased)   Maternal Grandmother (Deceased)   Maternal Grandfather (Deceased)   Paternal Grandmother (Deceased)   Paternal Grandfather (Deceased)     Social History:  Alcohol: none  Smoking: 1 PPD; which is decreased from 2 PPD  Labs: Lab Results  Component Value Date   HGBA1C 5.7 11/14/2009    Wt Readings from Last 1 Encounters:  07/20/23 229 lb 12.8 oz (104.2 kg)    BP Readings from Last 1 Encounters:  07/20/23 98/68   Pulse Readings from Last 1 Encounters:  07/20/23 65       Component Value Date/Time   CHOL 117 11/07/2023 1027   TRIG 202 (H) 11/07/2023 1027   HDL 35 (L) 11/07/2023 1027   CHOLHDL 3.3 11/07/2023 1027   CHOLHDL 7 10/26/2013 1104   VLDL 53.6 (H) 10/26/2013 1104   LDLCALC 49 11/07/2023 1027   LDLDIRECT 192.8 10/26/2013 1104    Past Medical History:  Diagnosis Date   CAD (coronary artery disease)    cypher stent   GERD (gastroesophageal reflux disease)    Headache    Heart attack (HCC)  x 2   HLD (hyperlipidemia)    HTN (hypertension)    Humerus fracture    Obesity    Tinnitus     Current Outpatient Medications on File Prior to Visit  Medication Sig Dispense Refill   aspirin  EC 81 MG EC tablet Take 1 tablet (81 mg total) by mouth daily.     Bempedoic Acid  (NEXLETOL ) 180 MG TABS TAKE 1 TABLET BY MOUTH EVERY DAY 90 tablet 0   clopidogrel  (PLAVIX ) 75 MG tablet TAKE 1 TABLET BY MOUTH DAILY 90 tablet 3   ezetimibe  (ZETIA ) 10 MG tablet TAKE 1 TABLET BY MOUTH DAILY (Patient taking differently: Take 10 mg by mouth daily. (Edited last rx to remove multiple asterisks )) 90 tablet 3   gabapentin  (NEURONTIN ) 100 MG capsule Take 1 capsule (100 mg total) by mouth 2 (two) times daily. 180 capsule 3   isosorbide  mononitrate (IMDUR ) 60 MG 24 hr tablet TAKE ONE TABLET BY MOUTH  DAILY 90 tablet 0   metoprolol  succinate (TOPROL -XL) 25 MG 24 hr tablet TAKE 1 TABLET BY MOUTH DAILY WITH OR IMMEDIATELY FOLLOWING A MEAL 90 tablet 3   nitroGLYCERIN  (NITROSTAT ) 0.4 MG SL tablet Place 1 tablet (0.4 mg total) under the tongue every 5 (five) minutes as needed (chest pain). 25 tablet 3   pantoprazole  (PROTONIX ) 40 MG tablet TAKE 1 TABLET BY MOUTH DAILY 90 tablet 3   potassium chloride  SA (KLOR-CON  M) 20 MEQ tablet TAKE 1 TABLET BY MOUTH TWICE A DAY 180 tablet 1   ramipril  (ALTACE ) 2.5 MG capsule TAKE 1 CAPSULE BY MOUTH EVERY DAY 90 capsule 3   rosuvastatin  (CRESTOR ) 40 MG tablet TAKE 1 TABLET BY MOUTH DAILY 90 tablet 3   torsemide  (DEMADEX ) 20 MG tablet TAKE THREE TABLETS (60MG ) BY MOUTH EVERY MORNING AND TAKE TWO TABLETS (40MG ) BY MOUTH EVERY AFTERNOON 540 tablet 2   No current facility-administered medications on file prior to visit.    Allergies  Allergen Reactions   Iodine Swelling and Rash    SWELLING REACTION UNSPECIFIED        Assessment/Plan:  1. Weight loss - Patient has not met goal of at least 5% of body weight loss with comprehensive lifestyle modifications alone in the past 3-6 months. Pharmacotherapy is appropriate to pursue as augmentation. Will start Zepbound. Confirmed patient not pregnant and no personal or family history of medullary thyroid carcinoma (MTC) or Multiple Endocrine Neoplasia syndrome type 2 (MEN 2). Injection technique reviewed at today's visit.  Advised patient on common side effects including nausea, diarrhea, dyspepsia, decreased appetite, and fatigue. Counseled patient on reducing meal size and how to titrate medication to minimize side effects. Counseled patient to call if intolerable side effects or if experiencing dehydration, abdominal pain, or dizziness. Along with pharmacotherapy, the patient will follow dietary modifications and aim for at least 150 minutes of moderate-intensity exercise per week, plus resistance training twice a week  (as recommended by the American Heart Association). This resistance training--such as weightlifting, bodyweight exercises, or using resistance bands, adapted to the patient's ability--will help prevent muscle loss.  Will send Zepbound prescription to [facility]. Discussed with patient Zepbound will come from [facility] and the associated costs. Additionally informed the patiet that she will receive vials and will draw up the dose each week. Discussed and printed out instructions for administering Zepbound from vials. Phone follow-ups will be conducted every 4 weeks for dose titration until the patient reaches the effective therapeutic dose and target weight. Instructed patient to call  me after she takes 3rd dose to discuss medication toleration and weight loss.  Jafeth Mustin E. Troyce Febo, Pharm.D [facility]. [facility] [address], [place], [place] [address] Phone: [phone]; Fax: [phone]

## 2024-07-02 NOTE — Patient Instructions (Addendum)
 GLP-1 Receptor Agonist Counseling Points This medication reduces your appetite and may make you feel fuller longer.  Stop eating when your body tells you that you are full. This will likely happen sooner than you are used to. Fried/greasy food and sweets may upset your stomach - minimize these as much as possible. Store your medication in the fridge until you are ready to use it. Inject your medication in the fatty tissue of your lower abdominal area (2 inches away from belly button) or upper outer thigh. Rotate injection sites. Common side effects include: nausea, diarrhea/constipation, and heartburn, and are more likely to occur if you overeat. Stop your injection for 7 days prior to surgical procedures requiring anesthesia.  Dosing schedule:  We will touch base with you monthly over the phone. The medication can be increased in monthly intervals depending on tolerability and efficacy.  Tips for success: Write down the reasons why you want to lose weight and post it in a place where you'll see it often.  Start small and work your way up. Keep in mind that it takes time to achieve goals, and small steps add up.  Any additional movements help to burn calories. Taking the stairs rather than the elevator and parking at the far end of your parking lot are easy ways to start. Brisk walking for at least 30 minutes 4 or more days of the week is an excellent goal to work toward  Understanding what it means to feel full: Did you know that it can take 15 minutes or more for your brain to receive the message that you've eaten? That means that, if you eat less food, but consume it slower, you may still feel satisfied.  Eating a lot of fruits and vegetables can also help you feel fuller.  Eat off of smaller plates so that moderate portions don't seem too small  Tips for living a healthier life     Building a Healthy and Balanced Diet Make most of your meal vegetables and fruits -  of your  plate. Aim for color and variety, and remember that potatoes don't count as vegetables on the Healthy Eating Plate because of their negative impact on blood sugar.  Go for whole grains -  of your plate. Whole and intact grains--whole wheat, barley, wheat berries, quinoa, oats, brown rice, and foods made with them, such as whole wheat pasta--have a milder effect on blood sugar and insulin than Chelly Dombeck bread, Carrine Kroboth rice, and other refined grains.  Protein power -  of your plate. Fish, poultry, beans, and nuts are all healthy, versatile protein sources--they can be mixed into salads, and pair well with vegetables on a plate. Limit red meat, and avoid processed meats such as bacon and sausage.  Healthy plant oils - in moderation. Choose healthy vegetable oils like olive, canola, soy, corn, sunflower, peanut, and others, and avoid partially hydrogenated oils, which contain unhealthy trans fats. Remember that low-fat does not mean "healthy."  Drink water, coffee, or tea. Skip sugary drinks, limit milk and dairy products to one to two servings per day, and limit juice to a small glass per day.  Stay active. The red figure running across the Healthy Eating Plate's placemat is a reminder that staying active is also important in weight control.  The main message of the Healthy Eating Plate is to focus on diet quality:  The type of carbohydrate in the diet is more important than the amount of carbohydrate in the diet, because some sources  of carbohydrate--like vegetables (other than potatoes), fruits, whole grains, and beans--are healthier than others. The Healthy Eating Plate also advises consumers to avoid sugary beverages, a major source of calories--usually with little nutritional value--in the American diet. The Healthy Eating Plate encourages consumers to use healthy oils, and it does not set a maximum on the percentage of calories people should get each day from healthy sources of fat. In this way,  the Healthy Eating Plate recommends the opposite of the low-fat message promoted for decades by the USDA.  [employer]  SUGAR  Sugar is a huge problem in the modern day diet. Sugar is a big contributor to heart disease, diabetes, high triglyceride levels, fatty liver disease and obesity. Sugar is hidden in almost all packaged foods/beverages. Added sugar is extra sugar that is added beyond what is naturally found and has no nutritional benefit for your body. The American Heart Association recommends limiting added sugars to no more than 25g for women and 36 grams for men per day. There are many names for sugar including maltose, sucrose (names ending in ose), high fructose corn syrup, molasses, cane sugar, corn sweetener, raw sugar, syrup, honey or fruit juice concentrate.   One of the best ways to limit your added sugars is to stop drinking sweetened beverages such as soda, sweet tea, and fruit juice.  There is 65g of added sugars in one 20oz bottle of Coke! That is equal to 7.5 donuts.   Pay attention and read all nutrition facts labels. Below is an examples of a nutrition facts label. The #1 is showing you the total sugars where the # 2 is showing you the added sugars. This one serving has almost the max amount of added sugars per day!   EXERCISE  Exercise is good. We've all heard that. In an ideal world, we would all have time and resources to get plenty of it. When you are active, your heart pumps more efficiently and you will feel better.  Multiple studies show that even walking regularly has benefits that include living a longer life. The American Heart Association recommends 150 minutes per week of exercise (30 minutes per day most days of the week). You can do this in any increment you wish. Nine or more 10-minute walks count. So does an hour-long exercise class. Break the time apart into what will work in your life. Some of the best  things you can do include walking briskly, jogging, cycling or swimming laps. Not everyone is ready to "exercise." Sometimes we need to start with just getting active. Here are some easy ways to be more active throughout the day:  Take the stairs instead of the elevator  Go for a 10-15 minute walk during your lunch break (find a friend to make it more enjoyable)  When shopping, park at the back of the parking lot  If you take public transportation, get off one stop early and walk the extra distance  Pace around while making phone calls  Check with your doctor if you aren't sure what your limitations may be. Always remember to drink plenty of water when doing any type of exercise. Don't feel like a failure if you're not getting the 90-150 minutes per week. If you started by being a couch potato, then just a 10-minute walk each day is a huge improvement. Start with little victories and work your way up.   HEALTHY EATING TIPS              Plan  ahead: make a menu of the meals for a week then create a grocery list to go with that menu. Consider meals that easily stretch into a night of leftovers, such as stews or casseroles. Or consider making two of your favorite meal and put one in the freezer for another night. Try a night or two each week that is "meatless" or "no cook" such as salads. When you get home from the grocery store wash and prepare your vegetables and fruits. Then when you need them they are ready to go.   Tips for going to the grocery store:  Buy store or generic brands  Check the weekly ad from your store on-line or in their in-store flyer  Look at the unit price on the shelf tag to compare/contrast the costs of different items  Buy fruits/vegetables in season  Carrots, bananas and apples are low-cost, naturally healthy items  If meats or frozen vegetables are on sale, buy some extras and put in your freezer  Limit buying prepared or "ready to eat" items, even if they are pre-made  salads or fruit snacks  Do not shop when you're hungry  Foods at eye level tend to be more expensive. Look on the high and low shelves for deals.  Consider shopping at the farmer's market for fresh foods in season.  Avoid the cookie and chip aisles (these are expensive, high in calories and low in nutritional value). Shop on the outside of the grocery store.  Healthy food preparations:  If you can't get lean hamburger, be sure to drain the fat when cooking  Steam, saut (in olive oil), grill or bake foods  Experiment with different seasonings to avoid adding salt to your foods. Kosher salt, sea salt and Himalayan salt are all still salt and should be avoided. Try seasoning food with onion, garlic, thyme, rosemary, basil ect. Onion powder or garlic powder is ok. Avoid if it says salt (ie garlic salt).          Dlisa Barnwell E. Baily Hovanec, Pharm.D [facility]. [facility] [address], [place], [place] [address] Phone: [phone]; Fax: [phone]

## 2024-07-05 ENCOUNTER — Other Ambulatory Visit: Payer: Self-pay | Admitting: Cardiology

## 2024-07-05 DIAGNOSIS — R0602 Shortness of breath: Secondary | ICD-10-CM

## 2024-07-12 NOTE — Telephone Encounter (Signed)
 Thank you! Patient decided to proceed with purchasing Zepbound from [facility]

## 2024-07-24 MED ORDER — ZEPBOUND 5 MG/0.5ML ~~LOC~~ SOLN: 5.0000 mg | SUBCUTANEOUS | 0 refills | Status: DC

## 2024-08-20 MED ORDER — ZEPBOUND 7.5 MG/0.5ML ~~LOC~~ SOLN: 7.5000 mg | SUBCUTANEOUS | 0 refills | Status: DC

## 2024-08-22 ENCOUNTER — Other Ambulatory Visit: Payer: Self-pay | Admitting: Cardiology

## 2024-09-11 ENCOUNTER — Other Ambulatory Visit: Payer: Self-pay | Admitting: Cardiology

## 2024-10-05 ENCOUNTER — Other Ambulatory Visit: Payer: Self-pay | Admitting: Cardiology

## 2024-10-05 DIAGNOSIS — R0602 Shortness of breath: Secondary | ICD-10-CM

## 2024-10-08 ENCOUNTER — Telehealth: Payer: Self-pay | Admitting: Pharmacy Technician

## 2024-10-08 NOTE — Telephone Encounter (Signed)
 Pharmacy Patient Advocate Encounter   Received notification from [facility] that prior authorization for nexletol  is required/requested.   Insurance verification completed.   The patient is insured through [employer].   Per test claim: PA required; PA submitted to above mentioned insurance via Latent Key/confirmation #/EOC [id number] Status is pending

## 2024-10-08 NOTE — Telephone Encounter (Signed)
 In accordance with refill protocols, please review and address the following requirements before this medication refill can be authorized:  Labs

## 2024-10-10 ENCOUNTER — Other Ambulatory Visit: Payer: Self-pay | Admitting: Cardiology

## 2024-10-15 NOTE — Telephone Encounter (Signed)
 Pharmacy Patient Advocate Encounter  Received notification from [facility] that Prior Authorization for nexletol  has been APPROVED from 09/08/24 to 10/15/25   PA #/Case ID/Reference #: [id number]

## 2024-10-18 ENCOUNTER — Telehealth: Payer: Self-pay | Admitting: Cardiology

## 2024-10-18 NOTE — Progress Notes (Signed)
 " Cardiology Office Note:   Date:  10/19/2024  ID:  [name], DOB 10-15-1960, MRN [id number] PCP: [name]  [facility] Providers Cardiologist:  Lynwood Schilling, MD {  History of Present Illness:   [name] is a 64 y.o. female who presents for follow up of  coronary disease since her 30s.  She had her first heart attack at the age of 36 and it was treated with a stent to her left circumflex. In July 2006, she underwent her most recent catheterization for recurrent angina. This showed in-stent restenosis of the circumflex as well as 80% lesion in the LAD and first diagonal.  She underwent Cypher DES placement for in-stent restenosis of the left circumflex as well as to the LAD. Dr Schilling saw her this past summer and she complained of chest pain and jaw pain.  Coronary CT was done July 2019.  However, because of previous stenting and her body habitus this  was not adequate to exclude obstructive coronary disease. In February 2020 she complained of jaw pain and chest pain, one episode made her break out into a sweat.  She saw [name] [facility] who convinced her to proceed with coronary angiogram. This was done 10/31/2018 and revealed patent LAD and CFX stents with a new pCFX 85% stenosis.  This was treated with PCI/ DES.  Her LVF was 55-65%.    Since I last saw her she has lost about 60 lbs with GLP1s.  She feels good.  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  She still bothered by neuropathic pain particularly at night. The patient denies any new symptoms such as chest discomfort, neck or arm discomfort. There has been no new shortness of breath, PND or orthopnea. There have been no reported palpitations, presyncope or syncope.  She thinks her breathing is better than previous.   ROS: As stated in the HPI and negative for all other systems.  Studies Reviewed:    EKG:   EKG Interpretation Date/Time:  Friday October 19 2024 09:49:16 EST Ventricular Rate:  69 PR Interval:  158 QRS Duration:  82 QT Interval:  416 QTC Calculation: 445 R Axis:   48  Text Interpretation: Normal sinus rhythm Normal ECG When compared with ECG of 20-Jul-2023 16:09, No significant change was found Confirmed by Schilling Rattan ([id number]) on 10/19/2024 9:57:15 AM ***  Risk Assessment/Calculations:              Physical Exam:   VS:  BP (!) 98/56 (BP Location: Left Arm, Patient Position: Sitting, Cuff Size: Large)   Pulse 71   Ht 5' 3 (1.6 m)   Wt 199 lb 3.2 oz (90.4 kg)   SpO2 96%   BMI 35.29 kg/m    Wt Readings from Last 3 Encounters:  10/19/24 199 lb 3.2 oz (90.4 kg)  07/02/24 227 lb (103 kg)  07/20/23 229 lb 12.8 oz (104.2 kg)     GEN: Well nourished, well developed in no acute distress NECK: No JVD; No carotid bruits CARDIAC: RRR, no murmurs, rubs, gallops RESPIRATORY:  Clear to auscultation without rales, wheezing or rhonchi  ABDOMEN: Soft, non-tender, non-distended EXTREMITIES:  No edema; No deformity   ASSESSMENT AND PLAN:   CAD S/P percutaneous coronary angioplasty:   The   patient has no ongoing symptoms.   She will continue risk reduction.     Essential hypertension: Her blood pressure is  at target and in fact low.  She is not having any symptoms related to this.  I told her that if she does start to get lightheaded I would back off on her ACE inhibitor she should just give us  a call if she starts to have lightheadedness, presyncope or syncope.    Leg pain: She had normal ABIs.  No change in therapy.    Neuropathy:    I have suggested maybe over-the-counter Benfotiamine   Tobacco abuse:   We again talked about this.  She does not have a desire to quit smoking at this point but she will think about it in the future.    Dyslipidemia:   LDL was 49.  Last year.  She is due for follow-up and I will send her for routine labs to include lipids.  She also is due for primary care labs and so I will check CBC,  c-Met and TSH.   Follow up with me in 1 year  Signed, Lynwood Schilling, MD   "

## 2024-10-18 NOTE — Telephone Encounter (Signed)
 Pt has moved her appt from 2/2 to 1/30 due to the weather. She wants to know if Dr Lavona wants her to have labs done and if so can he put the orders in.

## 2024-10-18 NOTE — Telephone Encounter (Signed)
 Informed pt there are no labs ordered at this time. Pts appt is in the morning, if Dr Lavona can discuss at appt. Recommended to pt to be fasting if labs were needed and she did not want to come another day. Verbalizes understanding.

## 2024-10-19 ENCOUNTER — Encounter: Payer: Self-pay | Admitting: Cardiology

## 2024-10-19 ENCOUNTER — Ambulatory Visit: Attending: Cardiology | Admitting: Cardiology

## 2024-10-19 VITALS — BP 98/56 | HR 71 | Ht 63.0 in | Wt 199.2 lb

## 2024-10-19 DIAGNOSIS — Z720 Tobacco use: Secondary | ICD-10-CM

## 2024-10-19 DIAGNOSIS — E785 Hyperlipidemia, unspecified: Secondary | ICD-10-CM

## 2024-10-19 DIAGNOSIS — Z9861 Coronary angioplasty status: Secondary | ICD-10-CM | POA: Diagnosis not present

## 2024-10-19 DIAGNOSIS — M79606 Pain in leg, unspecified: Secondary | ICD-10-CM | POA: Diagnosis not present

## 2024-10-19 DIAGNOSIS — I10 Essential (primary) hypertension: Secondary | ICD-10-CM | POA: Diagnosis not present

## 2024-10-19 DIAGNOSIS — I2510 Atherosclerotic heart disease of native coronary artery without angina pectoris: Secondary | ICD-10-CM

## 2024-10-19 LAB — CBC
Hematocrit: 44.5 % (ref 34.0–46.6)
Hemoglobin: 15.1 g/dL (ref 11.1–15.9)
MCH: 33.6 pg — ABNORMAL HIGH (ref 26.6–33.0)
MCHC: 33.9 g/dL (ref 31.5–35.7)
MCV: 99 fL — ABNORMAL HIGH (ref 79–97)
Platelets: 238 10*3/uL (ref 150–450)
RBC: 4.5 x10E6/uL (ref 3.77–5.28)
RDW: 11.8 % (ref 11.7–15.4)
WBC: 7.2 10*3/uL (ref 3.4–10.8)

## 2024-10-19 LAB — COMPREHENSIVE METABOLIC PANEL WITH GFR
ALT: 27 [IU]/L (ref 0–32)
AST: 33 [IU]/L (ref 0–40)
Albumin: 4.6 g/dL (ref 3.9–4.9)
Alkaline Phosphatase: 74 [IU]/L (ref 49–135)
BUN/Creatinine Ratio: 11 — ABNORMAL LOW (ref 12–28)
BUN: 14 mg/dL (ref 8–27)
Bilirubin Total: 0.6 mg/dL (ref 0.0–1.2)
CO2: 27 mmol/L (ref 20–29)
Calcium: 9.7 mg/dL (ref 8.7–10.3)
Chloride: 100 mmol/L (ref 96–106)
Creatinine, Ser: 1.22 mg/dL — ABNORMAL HIGH (ref 0.57–1.00)
Globulin, Total: 2.4 g/dL (ref 1.5–4.5)
Glucose: 89 mg/dL (ref 70–99)
Potassium: 4.1 mmol/L (ref 3.5–5.2)
Sodium: 143 mmol/L (ref 134–144)
Total Protein: 7 g/dL (ref 6.0–8.5)
eGFR: 50 mL/min/{1.73_m2} — ABNORMAL LOW

## 2024-10-19 LAB — LIPID PANEL
Chol/HDL Ratio: 3.6 ratio (ref 0.0–4.4)
Cholesterol, Total: 126 mg/dL (ref 100–199)
HDL: 35 mg/dL — ABNORMAL LOW
LDL Chol Calc (NIH): 58 mg/dL (ref 0–99)
Triglycerides: 198 mg/dL — ABNORMAL HIGH (ref 0–149)
VLDL Cholesterol Cal: 33 mg/dL (ref 5–40)

## 2024-10-19 LAB — TSH: TSH: 2.54 u[IU]/mL (ref 0.450–4.500)

## 2024-10-19 NOTE — Patient Instructions (Addendum)
 Medication Instructions:  Your physician recommends that you continue on your current medications as directed. Please refer to the Current Medication list given to you today.  *If you need a refill on your cardiac medications before your next appointment, please call your pharmacy*  Lab Work: Fasting lipid panel, CBC, CMET, TSH today at [facility] If you have labs (blood work) drawn today and your tests are completely normal, you will receive your results only by: MyChart Message (if you have MyChart) OR A paper copy in the mail If you have any lab test that is abnormal or we need to change your treatment, we will call you to review the results.  Testing/Procedures: NONE  Follow-Up: At [facility], you and your health needs are our priority.  As part of our continuing mission to provide you with exceptional heart care, our providers are all part of one team.  This team includes your primary Cardiologist (physician) and Advanced Practice Providers or APPs (Physician Assistants and Nurse Practitioners) who all work together to provide you with the care you need, when you need it.  Your next appointment:   1 year(s)  Provider:   Lynwood Schilling, MD    We recommend signing up for the patient portal called MyChart.  Sign up information is provided on this After Visit Summary.  MyChart is used to connect with patients for Virtual Visits (Telemedicine).  Patients are able to view lab/test results, encounter notes, upcoming appointments, etc.  Non-urgent messages can be sent to your provider as well.   To learn more about what you can do with MyChart, go to [email or web address].   Other Instructions  **Benfotiamine

## 2024-10-22 ENCOUNTER — Ambulatory Visit: Admitting: Cardiology

## 2024-10-22 ENCOUNTER — Ambulatory Visit: Payer: Self-pay | Admitting: Cardiology
